# Patient Record
Sex: Female | Born: 1950 | Race: White | Hispanic: No | Marital: Married | Smoking: Never smoker
Health system: Southern US, Community
[De-identification: ages and names within clinical notes are randomized; demographics above are authoritative.]

## PROBLEM LIST (undated history)

## (undated) DIAGNOSIS — K635 Polyp of colon: Secondary | ICD-10-CM

## (undated) DIAGNOSIS — J0100 Acute maxillary sinusitis, unspecified: Secondary | ICD-10-CM

## (undated) DIAGNOSIS — F419 Anxiety disorder, unspecified: Secondary | ICD-10-CM

## (undated) DIAGNOSIS — E785 Hyperlipidemia, unspecified: Secondary | ICD-10-CM

## (undated) DIAGNOSIS — E079 Disorder of thyroid, unspecified: Secondary | ICD-10-CM

## (undated) HISTORY — DX: Disorder of thyroid, unspecified: E07.9

## (undated) HISTORY — DX: Anxiety disorder, unspecified: F41.9

## (undated) HISTORY — DX: Hyperlipidemia, unspecified: E78.5

## (undated) HISTORY — PX: COLONOSCOPY: SHX174

## (undated) HISTORY — DX: Polyp of colon: K63.5

## (undated) HISTORY — DX: Acute maxillary sinusitis, unspecified: J01.00

## (undated) HISTORY — PX: TONSILLECTOMY: SUR1361

---

## 1971-03-14 HISTORY — PX: BREAST LUMPECTOMY: SHX2

## 1997-10-29 ENCOUNTER — Other Ambulatory Visit: Admission: RE | Admit: 1997-10-29 | Discharge: 1997-10-29 | Payer: Self-pay | Admitting: Gynecology

## 1998-04-21 ENCOUNTER — Other Ambulatory Visit: Admission: RE | Admit: 1998-04-21 | Discharge: 1998-04-21 | Payer: Self-pay | Admitting: Gynecology

## 1998-07-03 ENCOUNTER — Other Ambulatory Visit: Admission: RE | Admit: 1998-07-03 | Discharge: 1998-07-03 | Payer: Self-pay | Admitting: Gynecology

## 1998-12-16 ENCOUNTER — Other Ambulatory Visit: Admission: RE | Admit: 1998-12-16 | Discharge: 1998-12-16 | Payer: Self-pay | Admitting: Gynecology

## 1999-06-27 ENCOUNTER — Other Ambulatory Visit: Admission: RE | Admit: 1999-06-27 | Discharge: 1999-06-27 | Payer: Self-pay | Admitting: Gynecology

## 2000-01-17 ENCOUNTER — Other Ambulatory Visit: Admission: RE | Admit: 2000-01-17 | Discharge: 2000-01-17 | Payer: Self-pay | Admitting: Gynecology

## 2001-03-08 ENCOUNTER — Other Ambulatory Visit: Admission: RE | Admit: 2001-03-08 | Discharge: 2001-03-08 | Payer: Self-pay | Admitting: Obstetrics and Gynecology

## 2002-03-10 ENCOUNTER — Other Ambulatory Visit: Admission: RE | Admit: 2002-03-10 | Discharge: 2002-03-10 | Payer: Self-pay | Admitting: Obstetrics and Gynecology

## 2002-10-10 ENCOUNTER — Encounter: Payer: Self-pay | Admitting: Family Medicine

## 2002-10-10 LAB — HM COLONOSCOPY: HM Colonoscopy: ABNORMAL

## 2003-03-30 ENCOUNTER — Other Ambulatory Visit: Admission: RE | Admit: 2003-03-30 | Discharge: 2003-03-30 | Payer: Self-pay | Admitting: Obstetrics and Gynecology

## 2004-03-13 HISTORY — PX: WRIST FRACTURE SURGERY: SHX121

## 2004-04-05 ENCOUNTER — Other Ambulatory Visit: Admission: RE | Admit: 2004-04-05 | Discharge: 2004-04-05 | Payer: Self-pay | Admitting: Obstetrics and Gynecology

## 2005-04-21 ENCOUNTER — Other Ambulatory Visit: Admission: RE | Admit: 2005-04-21 | Discharge: 2005-04-21 | Payer: Self-pay | Admitting: Obstetrics and Gynecology

## 2005-10-13 ENCOUNTER — Ambulatory Visit: Payer: Self-pay | Admitting: Family Medicine

## 2005-10-13 ENCOUNTER — Encounter: Payer: Self-pay | Admitting: Family Medicine

## 2006-04-18 ENCOUNTER — Ambulatory Visit: Payer: Self-pay | Admitting: Family Medicine

## 2007-09-16 ENCOUNTER — Encounter (INDEPENDENT_AMBULATORY_CARE_PROVIDER_SITE_OTHER): Payer: Self-pay | Admitting: *Deleted

## 2007-09-16 ENCOUNTER — Encounter: Payer: Self-pay | Admitting: Family Medicine

## 2007-09-16 LAB — CONVERTED CEMR LAB
ALT: 17 units/L
AST: 16 units/L
CO2, serum: 22 mmol/L
Calcium: 9.6 mg/dL
Chloride, Serum: 106 mmol/L
Cholesterol: 200 mg/dL
Sodium, serum: 140 mmol/L
Total Bilirubin: 0.8 mg/dL

## 2007-10-17 ENCOUNTER — Encounter: Payer: Self-pay | Admitting: Family Medicine

## 2008-09-23 ENCOUNTER — Ambulatory Visit: Payer: Self-pay | Admitting: Family Medicine

## 2008-09-23 DIAGNOSIS — E559 Vitamin D deficiency, unspecified: Secondary | ICD-10-CM

## 2008-09-24 ENCOUNTER — Encounter: Payer: Self-pay | Admitting: Family Medicine

## 2008-09-25 ENCOUNTER — Telehealth (INDEPENDENT_AMBULATORY_CARE_PROVIDER_SITE_OTHER): Payer: Self-pay | Admitting: *Deleted

## 2008-09-25 ENCOUNTER — Encounter (INDEPENDENT_AMBULATORY_CARE_PROVIDER_SITE_OTHER): Payer: Self-pay | Admitting: *Deleted

## 2008-09-25 LAB — CONVERTED CEMR LAB
Alkaline Phosphatase: 75 units/L (ref 39–117)
Basophils Absolute: 0.1 10*3/uL (ref 0.0–0.1)
Bilirubin, Direct: 0.1 mg/dL (ref 0.0–0.3)
CO2: 29 meq/L (ref 19–32)
Calcium: 9.5 mg/dL (ref 8.4–10.5)
Cholesterol: 205 mg/dL — ABNORMAL HIGH (ref 0–200)
Creatinine, Ser: 0.8 mg/dL (ref 0.4–1.2)
Eosinophils Absolute: 0.1 10*3/uL (ref 0.0–0.7)
Glucose, Bld: 104 mg/dL — ABNORMAL HIGH (ref 70–99)
Lymphocytes Relative: 25 % (ref 12.0–46.0)
MCHC: 34.5 g/dL (ref 30.0–36.0)
Neutrophils Relative %: 61 % (ref 43.0–77.0)
RBC: 4.55 M/uL (ref 3.87–5.11)
RDW: 13.8 % (ref 11.5–14.6)
Total CHOL/HDL Ratio: 5
Triglycerides: 95 mg/dL (ref 0.0–149.0)
Vit D, 25-Hydroxy: 48 ng/mL (ref 30–89)

## 2008-11-02 ENCOUNTER — Telehealth (INDEPENDENT_AMBULATORY_CARE_PROVIDER_SITE_OTHER): Payer: Self-pay | Admitting: *Deleted

## 2008-11-23 ENCOUNTER — Ambulatory Visit: Payer: Self-pay | Admitting: Family Medicine

## 2008-11-23 DIAGNOSIS — G4700 Insomnia, unspecified: Secondary | ICD-10-CM

## 2008-11-23 DIAGNOSIS — R030 Elevated blood-pressure reading, without diagnosis of hypertension: Secondary | ICD-10-CM

## 2008-11-23 DIAGNOSIS — E785 Hyperlipidemia, unspecified: Secondary | ICD-10-CM | POA: Insufficient documentation

## 2009-02-03 ENCOUNTER — Ambulatory Visit: Payer: Self-pay | Admitting: Family Medicine

## 2009-02-08 LAB — CONVERTED CEMR LAB
ALT: 25 units/L (ref 0–35)
AST: 27 units/L (ref 0–37)
Alkaline Phosphatase: 79 units/L (ref 39–117)
Total Bilirubin: 1.2 mg/dL (ref 0.3–1.2)

## 2009-05-05 ENCOUNTER — Encounter: Admission: RE | Admit: 2009-05-05 | Discharge: 2009-07-06 | Payer: Self-pay | Admitting: Orthopedic Surgery

## 2009-06-16 ENCOUNTER — Ambulatory Visit: Payer: Self-pay | Admitting: Family Medicine

## 2009-07-15 ENCOUNTER — Telehealth (INDEPENDENT_AMBULATORY_CARE_PROVIDER_SITE_OTHER): Payer: Self-pay | Admitting: *Deleted

## 2009-09-14 ENCOUNTER — Ambulatory Visit: Payer: Self-pay | Admitting: Family Medicine

## 2009-09-14 DIAGNOSIS — H6690 Otitis media, unspecified, unspecified ear: Secondary | ICD-10-CM | POA: Insufficient documentation

## 2009-09-14 LAB — CONVERTED CEMR LAB: Rapid Strep: NEGATIVE

## 2009-09-30 ENCOUNTER — Ambulatory Visit: Payer: Self-pay | Admitting: Family Medicine

## 2009-10-25 ENCOUNTER — Encounter: Payer: Self-pay | Admitting: Family Medicine

## 2009-10-28 ENCOUNTER — Encounter: Payer: Self-pay | Admitting: Family Medicine

## 2009-10-28 LAB — HM MAMMOGRAPHY: HM Mammogram: NORMAL

## 2009-11-02 ENCOUNTER — Telehealth (INDEPENDENT_AMBULATORY_CARE_PROVIDER_SITE_OTHER): Payer: Self-pay | Admitting: *Deleted

## 2009-11-03 ENCOUNTER — Encounter (INDEPENDENT_AMBULATORY_CARE_PROVIDER_SITE_OTHER): Payer: Self-pay | Admitting: *Deleted

## 2010-01-31 ENCOUNTER — Telehealth (INDEPENDENT_AMBULATORY_CARE_PROVIDER_SITE_OTHER): Payer: Self-pay | Admitting: *Deleted

## 2010-04-10 LAB — CONVERTED CEMR LAB
Albumin: 4.4 g/dL (ref 3.5–5.2)
Basophils Relative: 0.5 % (ref 0.0–3.0)
Bilirubin, Direct: 0.2 mg/dL (ref 0.0–0.3)
Calcium: 9.6 mg/dL (ref 8.4–10.5)
Creatinine, Ser: 0.7 mg/dL (ref 0.4–1.2)
Eosinophils Relative: 2.6 % (ref 0.0–5.0)
Lymphocytes Relative: 31.9 % (ref 12.0–46.0)
Monocytes Relative: 9.9 % (ref 3.0–12.0)
Neutrophils Relative %: 55.1 % (ref 43.0–77.0)
RBC: 4.24 M/uL (ref 3.87–5.11)
Sodium: 144 meq/L (ref 135–145)
Total Bilirubin: 1.1 mg/dL (ref 0.3–1.2)
Total CHOL/HDL Ratio: 5
Total Protein: 7.6 g/dL (ref 6.0–8.3)
VLDL: 14 mg/dL (ref 0.0–40.0)
Vit D, 25-Hydroxy: 60 ng/mL (ref 30–89)
WBC: 4.7 10*3/uL (ref 4.5–10.5)

## 2010-04-12 NOTE — Progress Notes (Signed)
Summary: ambien refill   Phone Note Refill Request Call back at [phone] Message from:  Pharmacy on Jul 15, 2009 12:44 PM  Refills Requested: Medication #1:  AMBIEN CR 12.5 MG TBCR 1 by mouth at bedtime as needed   Dosage confirmed as above?Dosage Confirmed   Brand Name Necessary? No   Supply Requested: 1 month   [facility]  Next Appointment Scheduled: none Initial call taken by: Harold Barban,  Jul 15, 2009 12:44 PM    Prescriptions: AMBIEN CR 12.5 MG TBCR (ZOLPIDEM TARTRATE) 1 by mouth at bedtime as needed  #30 x 0   Entered by:   Doristine Devoid   Authorized by:   Neena Rhymes MD   Signed by:   Doristine Devoid on 07/15/2009   Method used:   Printed then faxed to ...       [facility] [phone]* (retail)       [address]       [place], [place]  [address]       Ph: [phone]       Fax: [phone]   RxID:   [id number]

## 2010-04-12 NOTE — Assessment & Plan Note (Signed)
Summary: loosing voice//lch   Vital Signs:  Patient profile:   60 year old female Weight:      140 pounds Temp:     99.0 degrees F oral BP sitting:   114 / 78  (left arm)  Vitals Entered By: Gabrielle Snow (June 16, 2009 2:18 PM) CC: loosing voice x1 week some dry cough   History of Present Illness: 60 yo woman here today for hoarseness.  started with nasal congestion 1 week ago and lost voice 3 days ago.  mild dry cough when pt strains to talk.  no fevers, ear pain.  nasal congestion has improved.  mild HA.  pt reports she overall feels 'well' but wants her voice back.  Allergies (verified): No Known Drug Allergies  Review of Systems      See HPI  Physical Exam  General:  Well-developed,well-nourished,in no acute distress; alert,appropriate and cooperative throughout examination Head:  no TTP over sinuses Eyes:  no injxn or inflammation Ears:  WNL Nose:  mild congestion Mouth:  + PND Neck:  No deformities, masses, or tenderness noted. Lungs:  Normal respiratory effort, chest expands symmetrically. Lungs are clear to auscultation, no crackles or wheezes. Heart:  Normal rate and regular rhythm. S1 and S2 normal without gallop, murmur, click, rub or other extra sounds.   Impression & Recommendations:  Problem # 1:  LARYNGITIS-ACUTE (ICD-464.00) Assessment New likely due to virus.  reviewed that there is no cure except time and vocal rest.  sample of Nasonex given for nasal congestion and PND.  reviewed supportive care and red flags that should prompt return.  Pt expresses understanding and is in agreement w/ this plan.  Complete Medication List: 1)  Ambien Cr 12.5 Mg Tbcr (Zolpidem tartrate) .Marland Kitchen.. 1 by mouth at bedtime as needed 2)  Zetia 10 Mg Tabs (Ezetimibe) .Marland Kitchen.. 1 by mouth daily  Patient Instructions: 1)  Please schedule a follow-up appointment as needed.  2)  Take Ibuprofen to decrease the inflammation 3)  Hot liquids will provide some relief 4)  REST YOUR  VOICE!!! 5)  Use the nasal steroid spray- 2 sprays each nostril once daily- to decrease post nasal drip 6)  Hang in there!!

## 2010-04-12 NOTE — Assessment & Plan Note (Signed)
Summary: SORE THROAT/FEVER//KN   Vital Signs:  Patient profile:   60 year old female Weight:      136 pounds Temp:     98.7 degrees F oral BP sitting:   112 / 70  (left arm)  Vitals Entered By: Doristine Devoid (September 14, 2009 11:14 AM) CC: sore throat xsat. fever up to 100.8   History of Present Illness: 60 yo woman here today for sore throat and fever.  sxs started on Sat.  + body aches, fatigue.  L ear pain.  mild nasal congestion but increased PND.  + HA- occipital.  no cough.  no known sick contacts.  Current Medications (verified): 1)  Ambien Cr 12.5 Mg Tbcr (Zolpidem Tartrate) .Marland Kitchen.. 1 By Mouth At Bedtime As Needed 2)  Zetia 10 Mg Tabs (Ezetimibe) .Marland Kitchen.. 1 By Mouth Daily  Allergies (verified): No Known Drug Allergies  Review of Systems      See HPI  Physical Exam  General:  Well-developed,well-nourished,in no acute distress; alert,appropriate and cooperative throughout examination Head:  no TTP over sinuses Eyes:  no injxn or inflammation Ears:  L TM dull, erythematous w/ visible fluid Nose:  mild congestion Mouth:  + PND Lungs:  Normal respiratory effort, chest expands symmetrically. Lungs are clear to auscultation, no crackles or wheezes. Heart:  Normal rate and regular rhythm. S1 and S2 normal without gallop, murmur, click, rub or other extra sounds. Cervical Nodes:  No lymphadenopathy noted   Impression & Recommendations:  Problem # 1:  LOM (ICD-382.9) Assessment New  pt w/ L ear infxn.  likely cause of body aches and fevers.  start amox.  reviewed supportive care and red flags that should prompt return.  Pt expresses understanding and is in agreement w/ this plan. Her updated medication list for this problem includes:    Amoxicillin 500 Mg Tabs (Amoxicillin) .Marland Kitchen... 1 tab by mouth two times a day x10 days  Orders: Rapid Strep (04540)  Complete Medication List: 1)  Ambien Cr 12.5 Mg Tbcr (Zolpidem tartrate) .Marland Kitchen.. 1 by mouth at bedtime as needed 2)  Zetia 10 Mg  Tabs (Ezetimibe) .Marland Kitchen.. 1 by mouth daily 3)  Amoxicillin 500 Mg Tabs (Amoxicillin) .Marland Kitchen.. 1 tab by mouth two times a day x10 days  Patient Instructions: 1)  Take the Amoxicillin as directed for your ear infection 2)  Start Mucinex to thin your congestion and facilitate drainage 3)  Drink plenty of fluids 4)  Tylenol/Ibuprofen for pain and fever 5)  Call with any questions or concerns 6)  Hang in there! Prescriptions: AMOXICILLIN 500 MG TABS (AMOXICILLIN) 1 tab by mouth two times a day x10 days  #20 x 0   Entered and Authorized by:   Neena Rhymes MD   Signed by:   Neena Rhymes MD on 09/14/2009   Method used:   [facility]ronically to        [facility] [phone][facility] (retail)       [address]       [place], [place]  [address]       Ph: [phone]       Fax: [phone]   RxID:   [id number]     Laboratory Results    Wet [place]/KOH  Other Tests  Rapid Strep: negative

## 2010-04-12 NOTE — Progress Notes (Signed)
Summary: Gabrielle Snow refill   Phone Note Refill Request Message from:  Fax from Pharmacy on January 31, 2010 8:31 AM  Refills Requested: Medication #1:  AMBIEN CR 12.5 MG TBCR 1 by mouth at bedtime as needed [facility] [phone] - tel [phone]  Next Appointment Scheduled: none Initial call taken by: Okey Regal Spring,  January 31, 2010 8:34 AM    Prescriptions: AMBIEN CR 12.5 MG TBCR (ZOLPIDEM TARTRATE) 1 by mouth at bedtime as needed  #30 x 1   Entered by:   Doristine Devoid CMA   Authorized by:   Neena Rhymes MD   Signed by:   Doristine Devoid CMA on 01/31/2010   Method used:   Printed then faxed to ...       [facility] [phone]* (retail)       [address]       [place], [place]  [address]       Ph: [phone]       Fax: [phone]   RxID:   [id number]

## 2010-04-12 NOTE — Miscellaneous (Signed)
  Clinical Lists Changes  Observations: Added new observation of MAMMOGRAM: normal (10/28/2009 14:34)      Preventive Care Screening  Mammogram:    Date:  10/28/2009    Results:  normal

## 2010-04-12 NOTE — Progress Notes (Signed)
Summary: bone density test  Phone Note Outgoing Call   Call placed by: Doristine Devoid CMA,  November 02, 2009 11:59 AM Call placed to: Patient Summary of Call: received bone density results- per Dr. Beverely Low osteopenic and 2 choices 1) caltrate otc two times a day or 2) fosamax 35mg  weekly should also increase weight bearing exercises (walking, light weights)  Follow-up for Phone Call        left message on machine .......Marland KitchenDoristine Devoid CMA  November 02, 2009 12:13 PM   Patient called and and LM on triage VM for a call back, [email or web address].Harold Barban  November 02, 2009 3:22 PM  Additional Follow-up for Phone Call Additional follow up Details #1::        Patient is aware and is going to try the caltrate. Additional Follow-up by: Harold Barban,  November 03, 2009 9:08 AM

## 2010-04-12 NOTE — Assessment & Plan Note (Signed)
Summary: cpx/pt will be fasting//kn   Vital Signs:  Patient profile:   60 year old female Height:      63.25 inches (160.66 cm) Weight:      133.13 pounds (60.51 kg) BMI:     23.48 Temp:     98.2 degrees F (36.78 degrees C) BP sitting:   126 / 84  (left arm) Cuff size:   regular  Vitals Entered By: Lucious Groves CMA (September 30, 2009 8:34 AM) CC: CPX./kb Is Patient Diabetic? No Pain Assessment Patient in pain? no      Comments Patient notes that she needs paper to take with her for Bone Density Scan tomorrow./kb   History of Present Illness: 60 yo woman here today for CPE.  GYN- Nestor Ramp, due in August.  Bone Density tomorrow at [facility].  insomnia- pt reports difficulty falling asleep b/c 'i can't turn my brain off'.  'my thoughts keep racing'.  pt admits she is 'high strung' and 'a worrier'.  has daughter's wedding coming up in 1 week and is stressed about this.  rarely takes her [place] script- 'only if i haven't slept in 5 days'.  Preventive Screening-Counseling & Management  Alcohol-Tobacco     Alcohol drinks/day: 0     Smoking Status: never  Caffeine-Diet-Exercise     Does Patient Exercise: yes     Type of exercise: walk on treadmill      Sexual History:  currently monogamous.        Drug Use:  never.    Current Medications (verified): 1)  Ambien Cr 12.5 Mg Tbcr (Zolpidem Tartrate) .Marland Kitchen.. 1 By Mouth At Bedtime As Needed 2)  Zetia 10 Mg Tabs (Ezetimibe) .Marland Kitchen.. 1 By Mouth Daily 3)  Vitamin D .... 1 By Mouth Qd 4)  Calcium .Marland Kitchen.. 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    Physical Exam  General:  Well-developed,well-nourished,in no acute distress; alert,appropriate and cooperative throughout examination Head:  Normocephalic and atraumatic without obvious abnormalities. No apparent alopecia or balding. Eyes:  PERRL, EOMI, fundi WNL, R eye w/ corneal abnormality (followed by ophtho) Ears:  External ear exam shows no significant lesions or deformities.  Otoscopic examination reveals clear canals, tympanic membranes are intact bilaterally without bulging, retraction, inflammation or discharge. Hearing is grossly normal bilaterally. Nose:  External nasal examination shows no deformity or inflammation. Nasal mucosa are pink and moist without lesions or exudates. Mouth:  Oral mucosa and oropharynx without lesions or exudates.  Teeth in good repair. Neck:  No deformities, masses, or tenderness noted. Breasts:  deferred to GYN Lungs:  Normal respiratory effort, chest expands symmetrically. Lungs are clear to auscultation, no crackles or wheezes. Heart:  Normal rate and regular rhythm. S1 and S2 normal without gallop, murmur, click, rub or other extra  sounds. Abdomen:  Bowel sounds positive,abdomen soft and non-tender without masses, organomegaly or hernias noted. Genitalia:  deferred to GYN Msk:  No deformity or scoliosis noted of thoracic or lumbar spine.   Pulses:  +2 carotid, radial, DP Extremities:  No clubbing, cyanosis, edema, or deformity noted with  normal full range of motion of all joints.   Neurologic:  No cranial nerve deficits noted. Station and gait are normal. Plantar reflexes are down-going bilaterally. DTRs are symmetrical throughout. Sensory, motor and coordinative functions appear intact. Skin:  Intact without suspicious lesions or rashes Cervical Nodes:  No lymphadenopathy noted Axillary Nodes:  No palpable lymphadenopathy Inguinal Nodes:  No significant adenopathy Psych:  Cognition and judgment appear intact. Alert and cooperative with normal attention span and concentration. No apparent delusions, illusions, hallucinations.  anxious.   Impression & Recommendations:  Problem # 1:  PHYSICAL EXAMINATION (ICD-V70.0) Assessment Unchanged Pt's PE WNL.  check labs.  anticipatory guidance provided.  UTD on health maintainence. Orders: TLB-BMP (Basic Metabolic Panel-BMET) (80048-METABOL) TLB-CBC Platelet - w/Differential (85025-CBCD) TLB-TSH (Thyroid Stimulating Hormone) (56213-YQM) Radiology Referral (Radiology) Specimen Handling (57846) EKG w/ Interpretation (93000)  Problem # 2:  HYPERLIPIDEMIA (ICD-272.4) Assessment: Unchanged due for labs Her updated medication list for this problem includes:    Zetia 10 Mg Tabs (Ezetimibe) .Marland Kitchen... 1 by mouth daily  Orders: Venipuncture (96295) TLB-Lipid Panel (80061-LIPID) TLB-Hepatic/Liver Function Pnl (80076-HEPATIC)  Problem # 3:  VITAMIN D DEFICIENCY (ICD-268.9) Assessment: Unchanged due for lab recheck.  has bone density scheduled for tomorrow Orders: T-Vitamin D (25-Hydroxy) [id number]) Radiology Referral (Radiology)  Problem # 4:  INSOMNIA-SLEEP DISORDER-UNSPEC (ICD-780.52) Assessment: Unchanged discussed possibility of starting SSRI or Buspar for anxiety.  pt wants to 'get through' the wedding and will see how her sleep changes once she is not worried about this.  will follow. Her updated medication list for this problem includes:    Ambien Cr 12.5 Mg Tbcr (Zolpidem  tartrate) .Marland Kitchen... 1 by mouth at bedtime as needed  Complete Medication List: 1)  Ambien Cr 12.5 Mg Tbcr (Zolpidem tartrate) .Marland Kitchen.. 1 by mouth at bedtime as needed 2)  Zetia 10 Mg Tabs (Ezetimibe) .Marland Kitchen.. 1 by mouth daily 3)  Vitamin D  .... 1 by mouth qd 4)  Calcium  .Marland KitchenMarland Kitchen. 1 by mouth bid  Patient Instructions: 1)  Follow up in 6 months or as needed 2)  If you decide you want to start a medicine for your anxiety/sleep issues- let me know 3)  Call with any questions or concerns 4)  We'll notify you of your lab results 5)  Enjoy the wedding!!

## 2010-06-19 ENCOUNTER — Other Ambulatory Visit: Payer: Self-pay | Admitting: Family Medicine

## 2010-09-07 ENCOUNTER — Other Ambulatory Visit: Payer: Self-pay | Admitting: Family Medicine

## 2010-09-07 MED ORDER — ZOLPIDEM TARTRATE ER 6.25 MG PO TBCR: 6.2500 mg | EXTENDED_RELEASE_TABLET | Freq: Every evening | ORAL | Status: DC | PRN

## 2010-09-07 NOTE — Telephone Encounter (Signed)
Refill sent. Mailed letter to schedule CPX.

## 2010-09-07 NOTE — Telephone Encounter (Signed)
Last refilled 01/2010. Pt CPX is coming due 09/2010.

## 2010-09-07 NOTE — Telephone Encounter (Signed)
Ok for #30, please remind her of CPE

## 2010-09-10 ENCOUNTER — Other Ambulatory Visit: Payer: Self-pay | Admitting: Family Medicine

## 2010-09-10 NOTE — Telephone Encounter (Signed)
Refill sent with notation

## 2010-09-10 NOTE — Telephone Encounter (Signed)
Notation was made on last refill (06/19/10) that pt needs appt before further refills. Please advise.

## 2010-09-10 NOTE — Telephone Encounter (Signed)
Ok to refill until August but then she is due for CPE

## 2010-09-12 ENCOUNTER — Telehealth: Payer: Self-pay | Admitting: *Deleted

## 2010-09-12 NOTE — Telephone Encounter (Signed)
Prior auth approved from 08-19-10 until 09-09-2011, pharmacy notified, approval letter scan to chart.

## 2010-09-28 ENCOUNTER — Other Ambulatory Visit (HOSPITAL_COMMUNITY): Payer: BC Managed Care – PPO

## 2010-09-28 ENCOUNTER — Encounter (HOSPITAL_COMMUNITY)
Admission: RE | Admit: 2010-09-28 | Discharge: 2010-09-28 | Disposition: A | Discharge status: Home / Self Care | Payer: BC Managed Care – PPO | Source: Ambulatory Visit | Attending: Ophthalmology | Admitting: Ophthalmology

## 2010-09-28 LAB — CBC
HCT: 37.6 % (ref 36.0–46.0)
MCV: 85.8 fL (ref 78.0–100.0)
Platelets: 192 10*3/uL (ref 150–400)
RBC: 4.38 MIL/uL (ref 3.87–5.11)
RDW: 13 % (ref 11.5–15.5)
WBC: 5 10*3/uL (ref 4.0–10.5)

## 2010-09-28 LAB — BASIC METABOLIC PANEL
Calcium: 9.8 mg/dL (ref 8.4–10.5)
GFR calc Af Amer: >60 mL/min (ref >60)
GFR calc non Af Amer: >60 mL/min (ref >60)
Glucose, Bld: 93 mg/dL (ref 70–99)
Potassium: 4.5 mEq/L (ref 3.5–5.1)
Sodium: 138 mEq/L (ref 135–145)

## 2010-09-29 ENCOUNTER — Encounter: Payer: Self-pay | Admitting: Family Medicine

## 2010-09-29 ENCOUNTER — Encounter: Payer: Self-pay | Admitting: *Deleted

## 2010-09-30 ENCOUNTER — Ambulatory Visit (HOSPITAL_COMMUNITY)
Admission: RE | Admit: 2010-09-30 | Discharge: 2010-09-30 | Disposition: A | Discharge status: Home / Self Care | Payer: BC Managed Care – PPO | Source: Ambulatory Visit | Attending: Ophthalmology | Admitting: Ophthalmology

## 2010-09-30 ENCOUNTER — Other Ambulatory Visit: Payer: Self-pay | Admitting: Ophthalmology

## 2010-09-30 DIAGNOSIS — H179 Unspecified corneal scar and opacity: Secondary | ICD-10-CM | POA: Insufficient documentation

## 2010-09-30 DIAGNOSIS — Z01812 Encounter for preprocedural laboratory examination: Secondary | ICD-10-CM | POA: Insufficient documentation

## 2010-09-30 DIAGNOSIS — H11009 Unspecified pterygium of unspecified eye: Secondary | ICD-10-CM | POA: Insufficient documentation

## 2010-10-11 ENCOUNTER — Encounter: Payer: Self-pay | Admitting: Family Medicine

## 2010-10-11 ENCOUNTER — Ambulatory Visit (INDEPENDENT_AMBULATORY_CARE_PROVIDER_SITE_OTHER): Payer: BC Managed Care – PPO | Admitting: Family Medicine

## 2010-10-11 DIAGNOSIS — Z0000 Encounter for general adult medical examination without abnormal findings: Secondary | ICD-10-CM | POA: Insufficient documentation

## 2010-10-11 DIAGNOSIS — E559 Vitamin D deficiency, unspecified: Secondary | ICD-10-CM

## 2010-10-11 DIAGNOSIS — E785 Hyperlipidemia, unspecified: Secondary | ICD-10-CM

## 2010-10-11 LAB — BASIC METABOLIC PANEL WITH GFR
BUN: 17 mg/dL (ref 6–23)
CO2: 31 meq/L (ref 19–32)
Calcium: 9.2 mg/dL (ref 8.4–10.5)
Chloride: 104 meq/L (ref 96–112)
Creatinine, Ser: 0.7 mg/dL (ref 0.4–1.2)
GFR: 97.31 mL/min
Glucose, Bld: 81 mg/dL (ref 70–99)
Potassium: 4 meq/L (ref 3.5–5.1)
Sodium: 143 meq/L (ref 135–145)

## 2010-10-11 LAB — HEPATIC FUNCTION PANEL
ALT: 21 U/L (ref 0–35)
AST: 21 U/L (ref 0–37)
Albumin: 4.8 g/dL (ref 3.5–5.2)
Alkaline Phosphatase: 74 U/L (ref 39–117)
Bilirubin, Direct: 0.1 mg/dL (ref 0.0–0.3)
Total Bilirubin: 1.3 mg/dL — ABNORMAL HIGH (ref 0.3–1.2)
Total Protein: 7.8 g/dL (ref 6.0–8.3)

## 2010-10-11 LAB — CBC WITH DIFFERENTIAL/PLATELET
Basophils Absolute: 0 10*3/uL (ref 0.0–0.1)
Eosinophils Relative: 3.5 % (ref 0.0–5.0)
HCT: 36.6 % (ref 36.0–46.0)
Lymphocytes Relative: 33.1 % (ref 12.0–46.0)
Monocytes Relative: 10.6 % (ref 3.0–12.0)
Neutrophils Relative %: 52.3 % (ref 43.0–77.0)
Platelets: 205 10*3/uL (ref 150.0–400.0)
RDW: 13.6 % (ref 11.5–14.6)
WBC: 4.9 10*3/uL (ref 4.5–10.5)

## 2010-10-11 LAB — LIPID PANEL
LDL Cholesterol: 123 mg/dL — ABNORMAL HIGH (ref 0–99)
Total CHOL/HDL Ratio: 5
Triglycerides: 75 mg/dL (ref 0.0–149.0)

## 2010-10-11 LAB — TSH: TSH: 3.49 u[IU]/mL (ref 0.35–5.50)

## 2010-10-11 NOTE — Assessment & Plan Note (Signed)
Pt's PE WNL.  UTD on health maintenance.  Check labs.  Anticipatory guidance provided.  

## 2010-10-11 NOTE — Assessment & Plan Note (Signed)
Check labs, restart meds prn.

## 2010-10-11 NOTE — Patient Instructions (Signed)
Your exam looks great!  Keep up the good work! We'll notify you of your lab results Call with any questions or concerns Congrats on the grandbabies!!!

## 2010-10-11 NOTE — Progress Notes (Signed)
  Subjective:    Patient ID: Gabrielle Snow, female    DOB: Mar 07, 1951, 60 y.o.   MRN: [id number]  HPI CPE- UTD on colonoscopy, mammo, pap.  GYN- Nestor Ramp.  No concerns today.  Review of Systems ROS Patient reports no vision/ hearing changes, adenopathy,fever, weight change,  persistant/recurrent hoarseness , swallowing issues, chest pain, palpitations, edema, persistant/recurrent cough, hemoptysis, dyspnea (rest/exertional/paroxysmal nocturnal), gastrointestinal bleeding (melena, rectal bleeding), abdominal pain, significant heartburn, bowel changes, GU symptoms (dysuria, hematuria, incontinence), Gyn symptoms (abnormal  bleeding, pain),  syncope, focal weakness, memory loss, numbness & tingling, skin/hair/nail changes, abnormal bruising or bleeding, anxiety, or depression.     Objective:   Physical Exam  General Appearance:    Alert, cooperative, no distress, appears stated age  Head:    Normocephalic, without obvious abnormality, atraumatic  Eyes:    PERRL, conjunctiva/corneas clear, EOM's intact, fundoscopic exam deferred to ophtho  Ears:    Normal TM's and external ear canals, both ears  Nose:   Nares normal, septum midline, mucosa normal, no drainage    or sinus tenderness  Throat:   Lips, mucosa, and tongue normal; teeth and gums normal  Neck:   Supple, symmetrical, trachea midline, no adenopathy;    Thyroid: no enlargement/tenderness/nodules  Back:     Symmetric, no curvature, ROM normal, no CVA tenderness  Lungs:     Clear to auscultation bilaterally, respirations unlabored  Chest Wall:    No tenderness or deformity   Heart:    Regular rate and rhythm, S1 and S2 normal, no murmur, rub   or gallop  Breast Exam:    Deferred to GYN  Abdomen:     Soft, non-tender, bowel sounds active all four quadrants,    no masses, no organomegaly  Genitalia:    Deferred to GYN  Rectal:    Deferred to GYN  Extremities:   Extremities normal, atraumatic, no cyanosis or edema  Pulses:   2+  and symmetric all extremities  Skin:   Skin color, texture, turgor normal, no rashes or lesions  Lymph nodes:   Cervical, supraclavicular, and axillary nodes normal  Neurologic:   CNII-XII intact, normal strength, sensation and reflexes    throughout          Assessment & Plan:   No problem-specific assessment & plan notes found for this encounter.

## 2010-10-11 NOTE — Assessment & Plan Note (Signed)
Check labs.  Adjust meds prn  

## 2010-10-12 ENCOUNTER — Encounter: Payer: Self-pay | Admitting: *Deleted

## 2010-10-14 LAB — VITAMIN D 1,25 DIHYDROXY
Vitamin D 1, 25 (OH)2 Total: 49 pg/mL (ref 18–72)
Vitamin D2 1, 25 (OH)2: <8 pg/mL

## 2010-10-20 ENCOUNTER — Other Ambulatory Visit: Payer: Self-pay | Admitting: Obstetrics and Gynecology

## 2010-11-02 NOTE — Op Note (Signed)
NAME:  Gabrielle Snow, Gabrielle Snow NO.:  [id number]  MEDICAL RECORD NO.:  LOCATION:                                 FACILITY:  PHYSICIAN:  Chapman Fitch, MD          DATE OF BIRTH:  DATE OF PROCEDURE:  09/30/2010 DATE OF DISCHARGE:                              OPERATIVE REPORT   PREOPERATIVE DIAGNOSES: 1. Corneal scar, right eye. 2. Corneal pterygium, right eye.  POSTOPERATIVE DIAGNOSES: 1. Corneal scar, right eye. 2. Corneal pterygium, right eye.  OPERATIVE PROCEDURE:  Ocular surface reconstruction with amniotic membrane grafting, right eye, [id number] - RT.  SURGEON:  Chapman Fitch, MD  ASSISTANT:  Dolly Rias, RN  ANESTHESIA:  Peribulbar block with monitored anesthesia care.  MATERIAL REPORTED TO LAB:  Pterygium tissue, right eye.  ESTIMATED BLOOD LOSS:  5 mL.  COMPLICATIONS:  None.  INDICATIONS FOR OPERATION:  The patient had chronic irritation, redness, and recurrent inflammation involving a Pterygium of the right eye.  The Pterygium was showing progressive growth, and developing corneal scarring along the anterior leading edge of the pterygium.  This was creating irregular corneal astigmatism and decreased visual acuity.  The corneal scarring was advancing through the visual axis.  The risks, benefits and alternatives to ocular surface reconstruction with removal of corneal scarring and pterygium were discussed with the patient in detail, including the risk of pterygium recurrence, pain, infection, chronic redness, persistent corneal astigmatism requiring refractive correction, and the risk of antimetabolite use including scleral and/or corneal melting postoperatively.  The patient understood and chose to proceed with the planned surgery.  Informed operative consent was obtained.  DESCRIPTION OF OPERATION:  The correct surgical site was identified and marked by the operating surgeon in the preoperative area.  The patient was then taken into the  operating room and placed supine on the operating table.  A time-out confirmation was performed by the entire surgical team including the Anesthesia provider.  Intravenous sedation was then administered by the anesthetist.  A peribulbar block was administered for the right eye using a retrobulbar needle and approximately 5 mL of an equal mixture of 1% lidocaine with epinephrine and 0.25% Marcaine mixed with Wydase.  The ocular surface and the periocular area were then prepped with ophthalmic Betadine and draped in usual sterile fashion for surgery.  Adequate ocular anesthesia was confirmed.  The operating microscope was brought into position over the operative eye.  The pterygium was identified and marked at its edges using a sterile skin marking pen.  The pterygium was then undermined and excised using sharp-tipped Westcott scissors with careful attention to avoid any iatrogenic injury to the medial rectus muscle.  The excision was carried out to remove the fibrous membrane and expose the various sclera.  Next, a #64 Beaver blade was used to excise the corneal scarring from the nasal aspect of the anterior cornea.  This was performed by carefully identifying a surgical plane just beneath the corneal scar, with the excision performed carefully and judiciously to avoid any excessive removal of corneal stromal tissue.  Next, the corneal excision site and limbal transition area were smoothed using an ophthalmic burr.  Excellent smoothing  of the corneal and limbal surfaces were obtained at the excision site.  Limited use of bipolar cautery was applied over the exposed sclera for hemostasis.  A surgical sponge was fashioned from an instrument wipe and trimmed to the side of the exposed sclera.  The surgical sponge was then soaked in mitomycin-C at concentration of 0.04%.  The sponge was then placed over the exposed sclera and to the undersurface of the conjunctiva for the exactly 20 seconds  and then removed.  Degrees irrigation of the ocular surface was then performed using balanced saline solution.  Next, an amniotic membrane graft was trimmed using Westcott scissors to completely cover the area of the exposed sclera and excision site with overlap of at least 1 mm of the graft beyond the conjunctival excision edges.  The graft was secured in place using a running 8-0 Vicryl suture, with the graft tucked under the conjunctival edges.  Next, the corneal light shield, which had been placed throughout the procedure, was removed from the corneal surface.  A bandage soft contact lens was applied over the cornea.  The eyelid speculum and drapes were removed.  Several drops of Polytrim were applied to the ocular surface.  The eye was then patched closed using sterile eye pads secured in place with tap.  The surgical specimen was sent for pathology, which is currently pending.  The patient was then transferred back to the recovery area in stable condition, having tolerated the procedure well, and having suffered no surgical or anesthetic complications.  The patient was discharged in satisfactory condition.  ______________________________ Chapman Fitch, MD     GH/MEDQ  D:  09/30/2010  T:  10/01/2010  Job:  [id number]  Electronically Signed by Chapman Fitch MD on 11/02/2010 12:57:03 PM

## 2010-12-05 ENCOUNTER — Other Ambulatory Visit: Payer: Self-pay | Admitting: Family Medicine

## 2011-02-01 ENCOUNTER — Other Ambulatory Visit: Payer: Self-pay | Admitting: Obstetrics and Gynecology

## 2011-02-10 ENCOUNTER — Other Ambulatory Visit: Payer: Self-pay | Admitting: Family Medicine

## 2011-02-10 MED ORDER — ZOLPIDEM TARTRATE ER 6.25 MG PO TBCR: 6.2500 mg | EXTENDED_RELEASE_TABLET | Freq: Every evening | ORAL | Status: DC | PRN

## 2011-02-10 NOTE — Telephone Encounter (Signed)
Ok for #30 

## 2011-02-10 NOTE — Telephone Encounter (Signed)
Manually faxed to [facility]

## 2011-02-10 NOTE — Telephone Encounter (Signed)
Last OV 10-11-10 last refill 09-09-10

## 2011-04-18 ENCOUNTER — Other Ambulatory Visit: Payer: Self-pay | Admitting: Family Medicine

## 2011-04-20 MED ORDER — EZETIMIBE 10 MG PO TABS: 10.0000 mg | ORAL_TABLET | Freq: Every day | ORAL | Status: DC

## 2011-04-20 NOTE — Telephone Encounter (Signed)
rx sent to pharmacy by e-script Letter has been mailed to pt address noted in the chart to advise they are overdue for cpe/ov/labs and the pt needs to contact office to set up appt   

## 2011-04-30 ENCOUNTER — Ambulatory Visit (INDEPENDENT_AMBULATORY_CARE_PROVIDER_SITE_OTHER): Payer: BC Managed Care – PPO | Admitting: Family Medicine

## 2011-04-30 VITALS — BP 125/82 | HR 76 | Temp 97.9°F | Resp 16 | Ht 62.5 in | Wt 127.6 lb

## 2011-04-30 DIAGNOSIS — J019 Acute sinusitis, unspecified: Secondary | ICD-10-CM

## 2011-04-30 MED ORDER — PREDNISONE 20 MG PO TABS: ORAL_TABLET | ORAL | Status: AC

## 2011-04-30 MED ORDER — AZITHROMYCIN 250 MG PO TABS: ORAL_TABLET | ORAL | Status: AC

## 2011-04-30 NOTE — Patient Instructions (Signed)
Thank you for coming in today.  I appreciate your patience as we become more comfortable with our computer system.  Today you saw Catlyn Shipton, MD. I hope you feel better quickly. If you were not given printed prescriptions today, your medications have been sent to your specified pharmacy and can be picked up there.  Please review the information below regarding your diagnosis(es) at your leisure.     Sinusitis Sinuses are air pockets within the bones of your face. The growth of bacteria within a sinus leads to infection. The infection prevents the sinuses from draining. This infection is called sinusitis. SYMPTOMS  There will be different areas of pain depending on which sinuses have become infected.  The maxillary sinuses often produce pain beneath the eyes.   Frontal sinusitis may cause pain in the middle of the forehead and above the eyes.  Other problems (symptoms) include:  Toothaches.   Colored, pus-like (purulent) drainage from the nose.   Swelling, warmth, and tenderness over the sinus areas may be signs of infection.  TREATMENT  Sinusitis is most often determined by an exam.X-rays may be taken. If x-rays have been taken, make sure you obtain your results or find out how you are to obtain them. Your caregiver may give you medications (antibiotics). These are medications that will help kill the bacteria causing the infection. You may also be given a medication (decongestant) that helps to reduce sinus swelling.  HOME CARE INSTRUCTIONS   Only take over-the-counter or prescription medicines for pain, discomfort, or fever as directed by your caregiver.   Drink extra fluids. Fluids help thin the mucus so your sinuses can drain more easily.   Applying either moist heat or ice packs to the sinus areas may help relieve discomfort.   Use saline nasal sprays to help moisten your sinuses. The sprays can be found at your local drugstore.  SEEK IMMEDIATE MEDICAL CARE IF:  You have a  fever.   You have increasing pain, severe headaches, or toothache.   You have nausea, vomiting, or drowsiness.   You develop unusual swelling around the face or trouble seeing.  MAKE SURE YOU:   Understand these instructions.   Will watch your condition.   Will get help right away if you are not doing well or get worse.  Document Released: 02/27/2005 Document Revised: 11/09/2010 Document Reviewed: 09/26/2006 Exit[employer] Patient Information 2012 [employer]. 

## 2011-04-30 NOTE — Progress Notes (Signed)
  Subjective:    Patient ID: Gabrielle Snow, female    DOB: 06/08/1950, 61 y.o.   MRN: [id number]  HPI 61 yo female with concern for sinusitis. Symptoms for 6 days.  Sinus headache.  Nasal congestion, colored.  Fatigue.  Malaise.  No cough.  No ST or ear.  PND.  No fever.  Tried sudafed, not helpful. 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  Grandsons also sick too.    Review of Systems Negative except as per HPI     Objective:   Physical Exam  Constitutional: She appears well-developed. No distress.  HENT:  Right Ear: Tympanic membrane, external ear and ear canal normal. Tympanic membrane is not injected, not scarred, not perforated, not erythematous, not retracted and not bulging.  Left Ear: Tympanic membrane, external ear and ear canal normal. Tympanic membrane is not injected, not scarred, not perforated, not erythematous, not retracted and not bulging.  Nose: Mucosal edema present. No rhinorrhea. Right sinus exhibits no maxillary sinus tenderness and no frontal sinus tenderness. Left sinus exhibits no maxillary sinus tenderness and no frontal sinus tenderness.  Mouth/Throat: Uvula is midline, oropharynx is clear and moist and mucous membranes are normal. No oropharyngeal exudate or tonsillar abscesses.  Cardiovascular: Normal rate, regular rhythm, normal heart sounds and intact distal pulses.   No murmur heard. Pulmonary/Chest: Effort normal and breath sounds normal. No respiratory distress. She has no wheezes. She has no rales.  Lymphadenopathy:       Head (right side): No submandibular and no preauricular adenopathy present.       Head (left side): No submandibular and no preauricular adenopathy present.       Right cervical: No superficial cervical and no posterior cervical adenopathy present.      Left cervical: No superficial cervical and no posterior cervical adenopathy present.       Right: No supraclavicular adenopathy present.       Left: No supraclavicular adenopathy  present.  Skin: Skin is warm and dry.          Assessment & Plan:  Sinusitis Zpak, prednisone

## 2011-06-20 ENCOUNTER — Other Ambulatory Visit: Payer: Self-pay | Admitting: *Deleted

## 2011-06-20 MED ORDER — EZETIMIBE 10 MG PO TABS: 10.0000 mg | ORAL_TABLET | Freq: Every day | ORAL | Status: DC

## 2011-06-20 NOTE — Telephone Encounter (Signed)
Refill request zetia 10mg  #30, rx sent to pharmacy by e-script Letter has been mailed to pt address noted in the chart to advise they are overdue for cpe/ov/labs and the pt needs to contact office to set up appt

## 2011-08-16 ENCOUNTER — Telehealth: Payer: Self-pay | Admitting: Family Medicine

## 2011-08-16 MED ORDER — ZOLPIDEM TARTRATE ER 6.25 MG PO TBCR: 6.2500 mg | EXTENDED_RELEASE_TABLET | Freq: Every evening | ORAL | Status: DC | PRN

## 2011-08-16 NOTE — Telephone Encounter (Signed)
Refill: Zolpidem tart er 6.25mg  tab. Take 1 tablet by mouth at bedtime as needed for sleep.

## 2011-08-16 NOTE — Telephone Encounter (Signed)
Ok for #30, 1 refill 

## 2011-08-16 NOTE — Telephone Encounter (Signed)
Last OV 10-11-10 last refill 02-10-11 #30 with no refills

## 2011-08-16 NOTE — Telephone Encounter (Signed)
.  rx faxed to pharmacy, manually.  

## 2011-08-18 ENCOUNTER — Telehealth: Payer: Self-pay | Admitting: Family Medicine

## 2011-08-18 MED ORDER — EZETIMIBE 10 MG PO TABS: 10.0000 mg | ORAL_TABLET | Freq: Every day | ORAL | Status: DC

## 2011-08-18 NOTE — Telephone Encounter (Signed)
rx sent to pharmacy by e-script Letter has been mailed to pt address noted in the chart to advise they are overdue for cpe/ov/labs and the pt needs to contact office to set up appt   

## 2011-08-18 NOTE — Telephone Encounter (Signed)
Refill: Zetia 10mg  tablet. Take 1 tablet by mouth daily. Qty 30. Last fill 06-20-11

## 2011-08-23 ENCOUNTER — Encounter: Payer: Self-pay | Admitting: Gastroenterology

## 2011-08-23 ENCOUNTER — Telehealth: Payer: Self-pay | Admitting: Family Medicine

## 2011-08-23 DIAGNOSIS — Z1211 Encounter for screening for malignant neoplasm of colon: Secondary | ICD-10-CM

## 2011-08-23 DIAGNOSIS — Z780 Asymptomatic menopausal state: Secondary | ICD-10-CM

## 2011-08-23 NOTE — Telephone Encounter (Signed)
Placed referral in chart for GI apt per they will have to schedule the colonoscopy and placed referral for dexa scan, called pt to advise that someone will call her about both apts once available, pt understood and wants call back on cell number [phone]

## 2011-08-23 NOTE — Telephone Encounter (Signed)
Pt states she is due for her colonoscopy. She has not had one in 10 years and she states she needs one this year. 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 She also states she is due for a bone density scan, and again, would like this done during the summer. Call back # [phone]

## 2011-09-28 ENCOUNTER — Ambulatory Visit (AMBULATORY_SURGERY_CENTER): Payer: BC Managed Care – PPO | Admitting: *Deleted

## 2011-09-28 ENCOUNTER — Telehealth: Payer: Self-pay | Admitting: *Deleted

## 2011-09-28 VITALS — Ht 63.0 in | Wt 132.0 lb

## 2011-09-28 DIAGNOSIS — Z1211 Encounter for screening for malignant neoplasm of colon: Secondary | ICD-10-CM

## 2011-09-28 NOTE — Telephone Encounter (Signed)
Left message on voice mail that colon would be due 2014. Colonoscopy Recall for 2014 entered into computer.

## 2011-09-28 NOTE — Telephone Encounter (Signed)
Next year please

## 2011-09-28 NOTE — Progress Notes (Signed)
Last colonoscopy 2004 with Dr. Leone Payor.  Normal except for hemorrhoids.  No GI complaints, no family hx of colon cancer.  I will send not to Dr. Leone Payor to see if pt is due for screening colonoscopy now or 2014.  Will notify pt after Dr. Leone Payor reviews note.

## 2011-09-28 NOTE — Telephone Encounter (Signed)
Dr. Leone Payor:  Pt had screening colonoscopy 2004 with you.  Findings: External hemorrhoids only.  Here today for PV for recall colon.  She is not having any GI issues and no family hx of colon cancer.  Is she due for colonoscopy 2014?  Pt willing to wait one more year. Please advise.

## 2011-10-16 ENCOUNTER — Ambulatory Visit (INDEPENDENT_AMBULATORY_CARE_PROVIDER_SITE_OTHER): Payer: BC Managed Care – PPO | Admitting: Family Medicine

## 2011-10-16 ENCOUNTER — Encounter: Payer: Self-pay | Admitting: Family Medicine

## 2011-10-16 ENCOUNTER — Other Ambulatory Visit: Payer: Self-pay | Admitting: Family Medicine

## 2011-10-16 ENCOUNTER — Ambulatory Visit (HOSPITAL_BASED_OUTPATIENT_CLINIC_OR_DEPARTMENT_OTHER)
Admission: RE | Admit: 2011-10-16 | Discharge: 2011-10-16 | Disposition: A | Discharge status: Home / Self Care | Payer: BC Managed Care – PPO | Source: Ambulatory Visit | Attending: Family Medicine | Admitting: Family Medicine

## 2011-10-16 VITALS — BP 122/72 | HR 76 | Temp 98.2°F | Wt 132.0 lb

## 2011-10-16 DIAGNOSIS — M25569 Pain in unspecified knee: Secondary | ICD-10-CM

## 2011-10-16 MED ORDER — MELOXICAM 15 MG PO TABS: ORAL_TABLET | ORAL | Status: DC

## 2011-10-16 NOTE — Progress Notes (Signed)
  Subjective:    Gabrielle Snow is a 61 y.o. female who presents with knee swelling involving the left knee. Onset was gradual, starting about 2 weeks ago. Inciting event: none known. Current symptoms include: stiffness and swelling. Pain is aggravated by lateral movements and rising after sitting. Patient has had no prior knee problems. Evaluation to date: none. Treatment to date: OTC analgesics which are somewhat effective.  The following portions of the patient's history were reviewed and updated as appropriate: allergies, current medications, past family history, past medical history, past social history, past surgical history and problem list.   Review of Systems Pertinent items are noted in HPI.   Objective:    BP 122/72  Pulse 76  Temp 98.2 F (36.8 C) (Oral)  Wt 132 lb (59.875 kg)  SpO2 97% Right knee: normal and no effusion, full active range of motion, no joint line tenderness, ligamentous structures intact.  Left knee:  positive exam findings: effusion and swelling laterally and negative exam findings: no erythema and no tenderness   X-ray left knee: not available    Assessment:    Left pain    Plan:    Educational materials distributed. Rest, ice, compression, and elevation (RICE) therapy. Reduction in offending activity. NSAIDs per medication orders.

## 2011-10-16 NOTE — Patient Instructions (Addendum)
Knee Pain The knee is the complex joint between your thigh and your lower leg. It is made up of bones, tendons, ligaments, and cartilage. The bones that make up the knee are:  The femur in the thigh.   The tibia and fibula in the lower leg.   The patella or kneecap riding in the groove on the lower femur.  CAUSES  Knee pain is a common complaint with many causes. A few of these causes are:  Injury, such as:   A ruptured ligament or tendon injury.   Torn cartilage.   Medical conditions, such as:   Gout   Arthritis   Infections   Overuse, over training or overdoing a physical activity.  Knee pain can be minor or severe. Knee pain can accompany debilitating injury. Minor knee problems often respond well to self-care measures or get well on their own. More serious injuries may need medical intervention or even surgery. SYMPTOMS The knee is complex. Symptoms of knee problems can vary widely. Some of the problems are:  Pain with movement and weight bearing.   Swelling and tenderness.   Buckling of the knee.   Inability to straighten or extend your knee.   Your knee locks and you cannot straighten it.   Warmth and redness with pain and fever.   Deformity or dislocation of the kneecap.  DIAGNOSIS  Determining what is wrong may be very straight forward such as when there is an injury. It can also be challenging because of the complexity of the knee. Tests to make a diagnosis may include:  Your caregiver taking a history and doing a physical exam.   Routine X-rays can be used to rule out other problems. X-rays will not reveal a cartilage tear. Some injuries of the knee can be diagnosed by:   Arthroscopy a surgical technique by which a small video camera is inserted through tiny incisions on the sides of the knee. This procedure is used to examine and repair internal knee joint problems. Tiny instruments can be used during arthroscopy to repair the torn knee cartilage  (meniscus).   Arthrography is a radiology technique. A contrast liquid is directly injected into the knee joint. Internal structures of the knee joint then become visible on X-ray film.   An MRI scan is a non x-ray radiology procedure in which magnetic fields and a computer produce two- or three-dimensional images of the inside of the knee. Cartilage tears are often visible using an MRI scanner. MRI scans have largely replaced arthrography in diagnosing cartilage tears of the knee.   Blood work.   Examination of the fluid that helps to lubricate the knee joint (synovial fluid). This is done by taking a sample out using a needle and a syringe.  TREATMENT The treatment of knee problems depends on the cause. Some of these treatments are:  Depending on the injury, proper casting, splinting, surgery or physical therapy care will be needed.   Give yourself adequate recovery time. Do not overuse your joints. If you begin to get sore during workout routines, back off. Slow down or do fewer repetitions.   For repetitive activities such as cycling or running, maintain your strength and nutrition.   Alternate muscle groups. For example if you are a weight lifter, work the upper body on one day and the lower body the next.   Either tight or weak muscles do not give the proper support for your knee. Tight or weak muscles do not absorb the stress placed   on the knee joint. Keep the muscles surrounding the knee strong.   Take care of mechanical problems.   If you have flat feet, orthotics or special shoes may help. See your caregiver if you need help.   Arch supports, sometimes with wedges on the inner or outer aspect of the heel, can help. These can shift pressure away from the side of the knee most bothered by osteoarthritis.   A brace called an "unloader" brace also may be used to help ease the pressure on the most arthritic side of the knee.   If your caregiver has prescribed crutches, braces,  wraps or ice, use as directed. The acronym for this is PRICE. This means protection, rest, ice, compression and elevation.   Nonsteroidal anti-inflammatory drugs (NSAID's), can help relieve pain. But if taken immediately after an injury, they may actually increase swelling. Take NSAID's with food in your stomach. Stop them if you develop stomach problems. Do not take these if you have a history of ulcers, stomach pain or bleeding from the bowel. Do not take without your caregiver's approval if you have problems with fluid retention, heart failure, or kidney problems.   For ongoing knee problems, physical therapy may be helpful.   Glucosamine and chondroitin are over-the-counter dietary supplements. Both may help relieve the pain of osteoarthritis in the knee. These medicines are different from the usual anti-inflammatory drugs. Glucosamine may decrease the rate of cartilage destruction.   Injections of a corticosteroid drug into your knee joint may help reduce the symptoms of an arthritis flare-up. They may provide pain relief that lasts a few months. You may have to wait a few months between injections. The injections do have a small increased risk of infection, water retention and elevated blood sugar levels.   Hyaluronic acid injected into damaged joints may ease pain and provide lubrication. These injections may work by reducing inflammation. A series of shots may give relief for as long as 6 months.   Topical painkillers. Applying certain ointments to your skin may help relieve the pain and stiffness of osteoarthritis. Ask your pharmacist for suggestions. Many over the-counter products are approved for temporary relief of arthritis pain.   In some countries, doctors often prescribe topical NSAID's for relief of chronic conditions such as arthritis and tendinitis. A review of treatment with NSAID creams found that they worked as well as oral medications but without the serious side effects.    PREVENTION  Maintain a healthy weight. Extra pounds put more strain on your joints.   Get strong, stay limber. Weak muscles are a common cause of knee injuries. Stretching is important. Include flexibility exercises in your workouts.   Be smart about exercise. If you have osteoarthritis, chronic knee pain or recurring injuries, you may need to change the way you exercise. This does not mean you have to stop being active. If your knees ache after jogging or playing basketball, consider switching to swimming, water aerobics or other low-impact activities, at least for a few days a week. Sometimes limiting high-impact activities will provide relief.   Make sure your shoes fit well. Choose footwear that is right for your sport.   Protect your knees. Use the proper gear for knee-sensitive activities. Use kneepads when playing volleyball or laying carpet. Buckle your seat belt every time you drive. Most shattered kneecaps occur in car accidents.   Rest when you are tired.  SEEK MEDICAL CARE IF:  You have knee pain that is continual and does not   seem to be getting better.  SEEK IMMEDIATE MEDICAL CARE IF:  Your knee joint feels hot to the touch and you have a high fever. MAKE SURE YOU:   Understand these instructions.   Will watch your condition.   Will get help right away if you are not doing well or get worse.  Document Released: 12/25/2006 Document Revised: 02/16/2011 Document Reviewed: 12/25/2006 [employer] Patient Information 2012 [employer]. 

## 2011-10-20 ENCOUNTER — Encounter: Payer: BC Managed Care – PPO | Admitting: Gastroenterology

## 2011-10-24 ENCOUNTER — Ambulatory Visit (INDEPENDENT_AMBULATORY_CARE_PROVIDER_SITE_OTHER): Payer: BC Managed Care – PPO | Admitting: Family Medicine

## 2011-10-24 ENCOUNTER — Encounter: Payer: Self-pay | Admitting: Family Medicine

## 2011-10-24 VITALS — BP 124/78 | HR 71 | Temp 98.0°F | Ht 62.5 in | Wt 130.0 lb

## 2011-10-24 DIAGNOSIS — E559 Vitamin D deficiency, unspecified: Secondary | ICD-10-CM

## 2011-10-24 DIAGNOSIS — Z0000 Encounter for general adult medical examination without abnormal findings: Secondary | ICD-10-CM

## 2011-10-24 DIAGNOSIS — E785 Hyperlipidemia, unspecified: Secondary | ICD-10-CM

## 2011-10-24 LAB — CBC WITH DIFFERENTIAL/PLATELET
Basophils Absolute: 0 10*3/uL (ref 0.0–0.1)
Eosinophils Relative: 3.9 % (ref 0.0–5.0)
HCT: 38.3 % (ref 36.0–46.0)
Lymphs Abs: 1.3 10*3/uL (ref 0.7–4.0)
MCV: 89 fl (ref 78.0–100.0)
Monocytes Absolute: 0.4 10*3/uL (ref 0.1–1.0)
Monocytes Relative: 9.6 % (ref 3.0–12.0)
Neutrophils Relative %: 57.2 % (ref 43.0–77.0)
Platelets: 201 10*3/uL (ref 150.0–400.0)
RDW: 13.6 % (ref 11.5–14.6)
WBC: 4.6 10*3/uL (ref 4.5–10.5)

## 2011-10-24 LAB — HEPATIC FUNCTION PANEL
AST: 20 U/L (ref 0–37)
Alkaline Phosphatase: 68 U/L (ref 39–117)
Bilirubin, Direct: 0.1 mg/dL (ref 0.0–0.3)

## 2011-10-24 LAB — LIPID PANEL
HDL: 43 mg/dL (ref >39.00)
LDL Cholesterol: 110 mg/dL — ABNORMAL HIGH (ref 0–99)
Total CHOL/HDL Ratio: 4
Triglycerides: 58 mg/dL (ref 0.0–149.0)
VLDL: 11.6 mg/dL (ref 0.0–40.0)

## 2011-10-24 LAB — BASIC METABOLIC PANEL
Calcium: 9.1 mg/dL (ref 8.4–10.5)
GFR: 90.6 mL/min (ref >60.00)
Potassium: 4.5 mEq/L (ref 3.5–5.1)
Sodium: 135 mEq/L (ref 135–145)

## 2011-10-24 NOTE — Progress Notes (Signed)
  Subjective:    Patient ID: Gabrielle Snow, female    DOB: 1950-10-26, 61 y.o.   MRN: [id number]  HPI CPE-  UTD on GYN.  No concerns today.  Due for colonoscopy next year.   Review of Systems Patient reports no vision/ hearing changes, adenopathy,fever, weight change,  persistant/recurrent hoarseness , swallowing issues, chest pain, palpitations, edema, persistant/recurrent cough, hemoptysis, dyspnea (rest/exertional/paroxysmal nocturnal), gastrointestinal bleeding (melena, rectal bleeding), abdominal pain, significant heartburn, bowel changes, GU symptoms (dysuria, hematuria, incontinence), Gyn symptoms (abnormal  bleeding, pain),  syncope, focal weakness, memory loss, numbness & tingling, skin/hair/nail changes, abnormal bruising or bleeding, anxiety, or depression.     Objective:   Physical Exam General Appearance:    Alert, cooperative, no distress, appears stated age  Head:    Normocephalic, without obvious abnormality, atraumatic  Eyes:    PERRL, conjunctiva/corneas clear, EOM's intact, fundi    benign, both eyes  Ears:    Normal TM's and external ear canals, both ears  Nose:   Nares normal, septum midline, mucosa normal, no drainage    or sinus tenderness  Throat:   Lips, mucosa, and tongue normal; teeth and gums normal  Neck:   Supple, symmetrical, trachea midline, no adenopathy;    Thyroid: no enlargement/tenderness/nodules  Back:     Symmetric, no curvature, ROM normal, no CVA tenderness  Lungs:     Clear to auscultation bilaterally, respirations unlabored  Chest Wall:    No tenderness or deformity   Heart:    Regular rate and rhythm, S1 and S2 normal, no murmur, rub   or gallop  Breast Exam:    Deferred to GYN  Abdomen:     Soft, non-tender, bowel sounds active all four quadrants,    no masses, no organomegaly  Genitalia:    Deferred to GYN  Rectal:    Extremities:   Extremities normal, atraumatic, no cyanosis or edema  Pulses:   2+ and symmetric all extremities    Skin:   Skin color, texture, turgor normal, no rashes or lesions  Lymph nodes:   Cervical, supraclavicular, and axillary nodes normal  Neurologic:   CNII-XII intact, normal strength, sensation and reflexes    throughout          Assessment & Plan:

## 2011-10-24 NOTE — Assessment & Plan Note (Signed)
Pt's PE WNL.  UTD on health maintenance.  Check labs.  Anticipatory guidance provided.  

## 2011-10-24 NOTE — Assessment & Plan Note (Signed)
Check labs.  Adjust meds prn  

## 2011-10-24 NOTE — Patient Instructions (Addendum)
Follow up in 6 months to recheck cholesterol We'll notify you of your lab results and make any changes if needed Keep up the good work!  You look great! Call with any questions or concerns Hang in there!!! 

## 2011-10-25 ENCOUNTER — Encounter: Payer: Self-pay | Admitting: *Deleted

## 2011-10-26 ENCOUNTER — Other Ambulatory Visit: Payer: Self-pay

## 2011-10-27 ENCOUNTER — Other Ambulatory Visit: Payer: Self-pay | Admitting: *Deleted

## 2011-10-27 LAB — VITAMIN D 1,25 DIHYDROXY: Vitamin D 1, 25 (OH)2 Total: 28 pg/mL (ref 18–72)

## 2011-10-27 MED ORDER — EZETIMIBE 10 MG PO TABS: 10.0000 mg | ORAL_TABLET | Freq: Every day | ORAL | Status: DC

## 2011-10-27 NOTE — Telephone Encounter (Signed)
Rx sent 

## 2011-11-07 ENCOUNTER — Telehealth: Payer: Self-pay | Admitting: Family Medicine

## 2011-11-07 NOTE — Telephone Encounter (Signed)
Caller: Sheronda/Patient; Patient Name: Gabrielle Snow; PCP: Sheliah Hatch.; Best Callback Phone Number: [phone]. States that someone from office called her phone approximately 5 times today. Was wondering if it was regarding bone density testing. States that there was also a message left on her home answering machine "the other day" regarding lab work results. States that she did receive a letter in the mail regarding labs. Was given all results except for her Bone Density Testing. Is confused and irritated that she can "never get anyone in the office and they left me voicemails to call back and when I call back I get you and can't speak to whomever called me." No record of any calls to patient in EPIC. Was told by Peri Jefferson, RN to send note to office for them to follow up. Patient request to PLEASE CALL HER CELL PHONE AFTER 3PM AND IF UNABLE TO REACH, PLEASE LEAVE A NUMBER WHERE SHE CAN ACTUALLY SPEAK TO SOMEONE WHEN SHE CALLS BACK. 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 Gets bad cell phone reception in school and calls don't always go through on her cell. Please advise.

## 2011-11-07 NOTE — Telephone Encounter (Signed)
Called pt to advise phone Vit D results per noted in EPIC at lab results window, advise pt best number to call back to this nurse personal extension, pt requested bone density results noted pt new DX of Osteopenia and instructions advise for pt to take Ca1200 and 800 Vit D, however per results from low Vit D state for pt to take 1000mg  daily, pt notes she currently takes Vit D and calcium in her MV, this nurse will contact pt back tomorrow after 3pm to clarify current dosage of vit D and calcium in current supplement per not at home at this time and unaware of current dosage, please note

## 2011-11-07 NOTE — Telephone Encounter (Signed)
Amount in multivitamin is not usually enough for a woman w/ osteopenia- which is why we recommend the 2 caltrate daily

## 2011-11-08 ENCOUNTER — Encounter: Payer: Self-pay | Admitting: Family Medicine

## 2011-11-08 NOTE — Telephone Encounter (Signed)
.  left message to have patient return my call on mobile and with family member at home,

## 2011-11-14 NOTE — Telephone Encounter (Signed)
Spoke to pt to advise results/instructions. Pt understood. Pt will now start taking the Caltrate OTC 2 tablets daily

## 2012-02-27 ENCOUNTER — Other Ambulatory Visit: Payer: Self-pay | Admitting: Family Medicine

## 2012-02-27 NOTE — Telephone Encounter (Signed)
refill Zolpidem Tartrate (Tab CR) CR 6.25 MG Take 1 tablet (6.25 mg total) by mouth at bedtime as needed for sleep --last fill 6.5.13, last ov wt/labs 8.13.13 V70.9

## 2012-02-27 NOTE — Telephone Encounter (Signed)
Please advise on RF request.//AB/CMA 

## 2012-02-28 ENCOUNTER — Other Ambulatory Visit: Payer: Self-pay | Admitting: *Deleted

## 2012-02-28 DIAGNOSIS — G4700 Insomnia, unspecified: Secondary | ICD-10-CM

## 2012-02-28 MED ORDER — ZOLPIDEM TARTRATE ER 6.25 MG PO TBCR: 6.2500 mg | EXTENDED_RELEASE_TABLET | Freq: Every evening | ORAL | Status: DC | PRN

## 2012-02-28 NOTE — Telephone Encounter (Signed)
Ok for #30, 1 refill (needs to sign controlled substance agreement) 

## 2012-03-01 NOTE — Telephone Encounter (Signed)
LM @ (9:49am) asking the pt to RTC//AB/CMA

## 2012-03-01 NOTE — Telephone Encounter (Signed)
Spoke with the pt and informed her that the rx she requested is ready, and we need her to pick it up and sign an agreement.  Pt agreed.//AB/CMA

## 2012-04-29 ENCOUNTER — Encounter: Payer: Self-pay | Admitting: Family Medicine

## 2012-09-04 ENCOUNTER — Encounter: Payer: Self-pay | Admitting: Family Medicine

## 2012-09-04 ENCOUNTER — Ambulatory Visit (INDEPENDENT_AMBULATORY_CARE_PROVIDER_SITE_OTHER): Payer: BC Managed Care – PPO | Admitting: Family Medicine

## 2012-09-04 VITALS — BP 130/80 | HR 90 | Temp 98.7°F | Ht 62.75 in | Wt 135.8 lb

## 2012-09-04 DIAGNOSIS — H6120 Impacted cerumen, unspecified ear: Secondary | ICD-10-CM

## 2012-09-04 DIAGNOSIS — J0100 Acute maxillary sinusitis, unspecified: Secondary | ICD-10-CM | POA: Insufficient documentation

## 2012-09-04 DIAGNOSIS — H6122 Impacted cerumen, left ear: Secondary | ICD-10-CM

## 2012-09-04 HISTORY — DX: Acute maxillary sinusitis, unspecified: J01.00

## 2012-09-04 MED ORDER — AMOXICILLIN 875 MG PO TABS: 875.0000 mg | ORAL_TABLET | Freq: Two times a day (BID) | ORAL | Status: DC

## 2012-09-04 NOTE — Assessment & Plan Note (Signed)
New.  Pt's sxs and PE consistent w/ infxn.  Start abx.  Reviewed supportive care and red flags that should prompt return.  Pt expressed understanding and is in agreement w/ plan.  

## 2012-09-04 NOTE — Patient Instructions (Addendum)
This is a sinus infection Start the Amoxicillin twice daily- take w/ food Drink plenty of fluids REST! Alternate tylenol and ibuprofen for pain relief Mucinex to keep your congestion thin Call with any questions or concerns Have a great time at the beach!!

## 2012-09-04 NOTE — Progress Notes (Signed)
  Subjective:    Patient ID: Gabrielle Snow, female    DOB: [birth year], 62 y.o.   MRN: [id number]  HPI    Review of Systems     Objective:   Physical Exam L TM obscured by soft, copious wax- easily removed w/ curette       Assessment & Plan:

## 2012-09-04 NOTE — Assessment & Plan Note (Signed)
New.  Wax removed w/ curette.  Pt tolerated w/out difficulty.

## 2012-09-04 NOTE — Progress Notes (Signed)
  Subjective:    Patient ID: Gabrielle Snow, female    DOB: 1950-06-26, 62 y.o.   MRN: [id number]  HPI URI- + nasal congestion, facial pressure.  + HA x3 days.  Started Mucinex w/out relief.  No fevers.  'i feel awful'.  + body aches, L ear fullness, sore throat.  No cough.  + sick contacts.  No N/V/D.   Review of Systems For ROS see HPI     Objective:   Physical Exam  Vitals reviewed. Constitutional: She appears well-developed and well-nourished. No distress.  HENT:  Head: Normocephalic and atraumatic.  Right Ear: Tympanic membrane normal.  Left Ear: Tympanic membrane normal.  Nose: Mucosal edema and rhinorrhea present. Right sinus exhibits maxillary sinus tenderness and frontal sinus tenderness. Left sinus exhibits maxillary sinus tenderness and frontal sinus tenderness.  Mouth/Throat: Uvula is midline and mucous membranes are normal. Posterior oropharyngeal erythema present. No oropharyngeal exudate.  Eyes: Conjunctivae and EOM are normal. Pupils are equal, round, and reactive to light.  Neck: Normal range of motion. Neck supple.  Cardiovascular: Normal rate, regular rhythm and normal heart sounds.   Pulmonary/Chest: Effort normal and breath sounds normal. No respiratory distress. She has no wheezes.  Lymphadenopathy:    She has no cervical adenopathy.          Assessment & Plan:

## 2012-09-12 ENCOUNTER — Encounter: Payer: Self-pay | Admitting: Internal Medicine

## 2012-09-17 ENCOUNTER — Telehealth: Payer: Self-pay | Admitting: Family Medicine

## 2012-09-17 NOTE — Telephone Encounter (Signed)
Patient Information:  Caller Name: Chrysta  Phone: [phone]  Patient: Gabrielle Snow, Gabrielle Snow  Gender: Female  DOB: [birth year]  Age: 62 Years  PCP: Sheliah Hatch.  Office Follow Up:  Does the office need to follow up with this patient?: No  Instructions For The Office: N/A  RN Note:  Left ear pressure and congestion with decreased hearing. No ear pain. Sinus symptoms resolved. Stopped Mucinex. Currently in [place].   Requested appointement for 09/19/12 so transferred to office scheduler/Adera who scheduled for 1100 09/19/12 with Dr Beverely Low. .   Symptoms  Reason For Call & Symptoms: Ongoing left ear congestion following 10 day of Amoxicillin for sinus infection  Reviewed Health History In EMR: Yes  Reviewed Medications In EMR: Yes  Reviewed Allergies In EMR: Yes  Reviewed Surgeries / Procedures: Yes  Date of Onset of Symptoms: 09/04/2012  Guideline(s) Used:  Ear - Congestion  Disposition Per Guideline:   See Within 3 Days in Office  Reason For Disposition Reached:   Ear congestion present > 48 hours  Advice Given:  Reassurance:  Definition: Ear congestion is the medical term used to describe symptoms of a stuffy, full, or plugged sensation in the ear. People also sometimes describe crackling or popping noises in the ear. Sometimes hearing seems slightly muffled.  Eustacian tube: There is a small collapsible tube that runs between the middle ear and the nose. Normally, it permits tiny amounts of air to move in and out of the middle ear. When the tube gets blocked, air or fluid can build up behind the ear drum (tympanic membrane). This causes the symptoms of ear congestion.  Here is some care advice that should help.  Causes  Blowing the nose too hard can also push air and fluid into the eustachian tube.  Treatment - Chewing and Swallowing:   Try chewing gum.  You can also try swallowing water while pinching your nostrils closed. The reason this works is that it creates a  small vacuum in the nose. This helps the eustachian tube to open up.  Caution - Nasal Decongestants:  Do not take these medications if you have high blood pressure, heart disease, prostate problems, or an overactive thyroid.  Do not use these medications for more than 3 days (Reason: rebound nasal congestion).  Call Back If:   Ear congestion lasts over 48 hours  Ear pain or fever occurs  You become worse.  Patient Will Follow Care Advice:  YES

## 2012-09-17 NOTE — Telephone Encounter (Signed)
Pt with pending appt 09-19-12

## 2012-09-19 ENCOUNTER — Encounter: Payer: Self-pay | Admitting: Internal Medicine

## 2012-09-19 ENCOUNTER — Encounter: Payer: Self-pay | Admitting: Family Medicine

## 2012-09-19 ENCOUNTER — Ambulatory Visit (INDEPENDENT_AMBULATORY_CARE_PROVIDER_SITE_OTHER): Payer: BC Managed Care – PPO | Admitting: Family Medicine

## 2012-09-19 VITALS — BP 122/82 | HR 74 | Temp 98.9°F

## 2012-09-19 DIAGNOSIS — H6120 Impacted cerumen, unspecified ear: Secondary | ICD-10-CM

## 2012-09-19 DIAGNOSIS — H6122 Impacted cerumen, left ear: Secondary | ICD-10-CM

## 2012-09-19 NOTE — Assessment & Plan Note (Signed)
Recurrent problem.  Wax pressed up against TM which is likely cause of pt's decreased hearing.  Pt reports hearing normalized after wax removal.  Pt very pleased.

## 2012-09-19 NOTE — Progress Notes (Signed)
  Subjective:    Patient ID: Gabrielle Snow, female    DOB: [birth year], 62 y.o.   MRN: [id number]  HPI Ear pain- pt has been on vacation x2.5 weeks and reports she's unable to hear out of L ear 'at all'.  + ear pressure.  + nasal congestion.  Treated for sinus infxn end of June w/ good results.  Pt fears she's going deaf.   Review of Systems For ROS see HPI     Objective:   Physical Exam  Vitals reviewed. Constitutional: She appears well-developed and well-nourished. No distress.  HENT:  Head: Normocephalic and atraumatic.  Nose: Nose normal.  Mouth/Throat: Oropharynx is clear and moist.  R ear canal clear and TM WNL L canal full of soft cerumen, complete obscuring TM.  Some wax able to be curetted but majority required irrigation.  Canal now completely clear and TM WNL.  Hearing WNL.          Assessment & Plan:

## 2012-10-16 ENCOUNTER — Encounter: Payer: Self-pay | Admitting: Women's Health

## 2012-10-16 ENCOUNTER — Ambulatory Visit (INDEPENDENT_AMBULATORY_CARE_PROVIDER_SITE_OTHER): Payer: BC Managed Care – PPO | Admitting: Women's Health

## 2012-10-16 ENCOUNTER — Other Ambulatory Visit (HOSPITAL_COMMUNITY)
Admission: RE | Admit: 2012-10-16 | Discharge: 2012-10-16 | Disposition: A | Discharge status: Home / Self Care | Payer: BC Managed Care – PPO | Source: Ambulatory Visit | Attending: Gynecology | Admitting: Gynecology

## 2012-10-16 VITALS — BP 110/62 | Ht 63.0 in | Wt 134.0 lb

## 2012-10-16 DIAGNOSIS — Z01419 Encounter for gynecological examination (general) (routine) without abnormal findings: Secondary | ICD-10-CM

## 2012-10-16 NOTE — Patient Instructions (Signed)
[facility] Dermatology  Dr Virgina Norfolk, [facility] Health Recommendations for Postmenopausal Women Respected and ongoing research has looked at the most common causes of death, disability, and poor quality of life in postmenopausal women. The causes include heart disease, diseases of blood vessels, diabetes, depression, cancer, and bone loss (osteoporosis). Many things can be done to help lower the chances of developing these and other common problems: CARDIOVASCULAR DISEASE Heart Disease: A heart attack is a medical emergency. Know the signs and symptoms of a heart attack. Below are things women can do to reduce their risk for heart disease.   Do not smoke. If you smoke, quit.  Aim for a healthy weight. Being overweight causes many preventable deaths. Eat a healthy and balanced diet and drink an adequate amount of liquids.  Get moving. Make a commitment to be more physically active. Aim for 30 minutes of activity on most, if not all days of the week.  Eat for heart health. Choose a diet that is low in saturated fat and cholesterol and eliminate trans fat. Include whole grains, vegetables, and fruits. Read and understand the labels on food containers before buying.  Know your numbers. Ask your caregiver to check your blood pressure, cholesterol (total, HDL, LDL, triglycerides) and blood glucose. Work with your caregiver on improving your entire clinical picture.  High blood pressure. Limit or stop your table salt intake (try salt substitute and food seasonings). Avoid salty foods and drinks. Read labels on food containers before buying. Eating well and exercising can help control high blood pressure. STROKE  Stroke is a medical emergency. Stroke may be the result of a blood clot in a blood vessel in the brain or by a brain hemorrhage (bleeding). Know the signs and symptoms of a stroke. To lower the risk of developing a stroke:  Avoid fatty foods.  Quit smoking.  Control your diabetes, blood  pressure, and irregular heart rate. THROMBOPHLEBITIS (BLOOD CLOT) OF THE LEG  Becoming overweight and leading a stationary lifestyle may also contribute to developing blood clots. Controlling your diet and exercising will help lower the risk of developing blood clots. CANCER SCREENING  Breast Cancer: Take steps to reduce your risk of breast cancer.  You should practice "breast self-awareness." This means understanding the normal appearance and feel of your breasts and should include breast self-examination. Any changes detected, no matter how small, should be reported to your caregiver.  After age 34, you should have a clinical breast exam (CBE) every year.  Starting at age 36, you should consider having a mammogram (breast X-ray) every year.  If you have a family history of breast cancer, talk to your caregiver about genetic screening.  If you are at high risk for breast cancer, talk to your caregiver about having an MRI and a mammogram every year.  Intestinal or Stomach Cancer: Tests to consider are a rectal exam, fecal occult blood, sigmoidoscopy, and colonoscopy. Women who are high risk may need to be screened at an earlier age and more often.  Cervical Cancer:  Beginning at age 57, you should have a Pap test every 3 years as long as the past 3 Pap tests have been normal.  If you have had past treatment for cervical cancer or a condition that could lead to cancer, you need Pap tests and screening for cancer for at least 20 years after your treatment.  If you had a hysterectomy for a problem that was not cancer or a condition that could lead to cancer, then  you no longer need Pap tests.  If you are between ages 32 and 75, and you have had normal Pap tests going back 10 years, you no longer need Pap tests.  If Pap tests have been discontinued, risk factors (such as a new sexual partner) need to be reassessed to determine if screening should be resumed.  Some medical problems can  increase the chance of getting cervical cancer. In these cases, your caregiver may recommend more frequent screening and Pap tests.  Uterine Cancer: If you have vaginal bleeding after reaching menopause, you should notify your caregiver.  Ovarian cancer: Other than yearly pelvic exams, there are no reliable tests available to screen for ovarian cancer at this time except for yearly pelvic exams.  Lung Cancer: Yearly chest X-rays can detect lung cancer and should be done on high risk women, such as cigarette smokers and women with chronic lung disease (emphysema).  Skin Cancer: A complete body skin exam should be done at your yearly examination. Avoid overexposure to the sun and ultraviolet light lamps. Use a strong sun block cream when in the sun. All of these things are important in lowering the risk of skin cancer. MENOPAUSE Menopause Symptoms: Hormone therapy products are effective for treating symptoms associated with menopause:  Moderate to severe hot flashes.  Night sweats.  Mood swings.  Headaches.  Tiredness.  Loss of sex drive.  Insomnia.  Other symptoms. Hormone replacement carries certain risks, especially in older women. Women who use or are thinking about using estrogen or estrogen with progestin treatments should discuss that with their caregiver. Your caregiver will help you understand the benefits and risks. The ideal dose of hormone replacement therapy is not known. The Food and Drug Administration (FDA) has concluded that hormone therapy should be used only at the lowest doses and for the shortest amount of time to reach treatment goals.  OSTEOPOROSIS Protecting Against Bone Loss and Preventing Fracture: If you use hormone therapy for prevention of bone loss (osteoporosis), the risks for bone loss must outweigh the risk of the therapy. Ask your caregiver about other medications known to be safe and effective for preventing bone loss and fractures. To guard against bone  loss or fractures, the following is recommended:  If you are less than age 28, take 1000 mg of calcium and at least 600 mg of Vitamin D per day.  If you are greater than age 33 but less than age 4, take 1200 mg of calcium and at least 600 mg of Vitamin D per day.  If you are greater than age 71, take 1200 mg of calcium and at least 800 mg of Vitamin D per day. Smoking and excessive alcohol intake increases the risk of osteoporosis. Eat foods rich in calcium and vitamin D and do weight bearing exercises several times a week as your caregiver suggests. DIABETES Diabetes Melitus: If you have Type I or Type 2 diabetes, you should keep your blood sugar under control with diet, exercise and recommended medication. Avoid too many sweets, starchy and fatty foods. Being overweight can make control more difficult. COGNITION AND MEMORY Cognition and Memory: Menopausal hormone therapy is not recommended for the prevention of cognitive disorders such as Alzheimer's disease or memory loss.  DEPRESSION  Depression may occur at any age, but is common in elderly women. The reasons may be because of physical, medical, social (loneliness), or financial problems and needs. If you are experiencing depression because of medical problems and control of symptoms, talk to  your caregiver about this. Physical activity and exercise may help with mood and sleep. Community and volunteer involvement may help your sense of value and worth. If you have depression and you feel that the problem is getting worse or becoming severe, talk to your caregiver about treatment options that are best for you. ACCIDENTS  Accidents are common and can be serious in the elderly woman. Prepare your house to prevent accidents. Eliminate throw rugs, place hand bars in the bath, shower and toilet areas. Avoid wearing high heeled shoes or walking on wet, snowy, and icy areas. Limit or stop driving if you have vision or hearing problems, or you feel you  are unsteady with you movements and reflexes. HEPATITIS C Hepatitis C is a type of viral infection affecting the liver. It is spread mainly through contact with blood from an infected person. It can be treated, but if left untreated, it can lead to severe liver damage over years. Many people who are infected do not know that the virus is in their blood. If you are a "baby-boomer", it is recommended that you have one screening test for Hepatitis C. IMMUNIZATIONS  Several immunizations are important to consider having during your senior years, including:   Tetanus, diptheria, and pertussis booster shot.  Influenza every year before the flu season begins.  Pneumonia vaccine.  Shingles vaccine.  Others as indicated based on your specific needs. Talk to your caregiver about these. Document Released: 04/21/2005 Document Revised: 02/14/2012 Document Reviewed: 12/16/2007 [facility] Patient Information 2014 [place], [place].

## 2012-10-16 NOTE — Progress Notes (Signed)
Gabrielle Snow 28-May-1950 [id number]    History:    New patient presents for annual exam.  Postmenopausal/no bleeding/no HRT. Benign left breast biopsy 1973 with normal mammogram history. Normal Pap history. Negative colonoscopy 2004 has scheduled colonoscopy September 2014. Sister with history of breast cancer at age 61 now age 75. DEXA reports minimal bone loss.   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 Has a 84 year old son. Father died of lung cancer, mother heart disease and hypertension died at 78.   ROS:  A  ROS was performed and pertinent positives and negatives are included in the history.  Exam:  Filed Vitals:   10/16/12 0901  BP: 110/62    General appearance:  Normal Head/Neck:  Normal, without cervical or supraclavicular adenopathy. Thyroid:  Symmetrical, normal in size, without palpable masses or nodularity. Respiratory  Effort:  Normal  Auscultation:  Clear without wheezing or rhonchi Cardiovascular  Auscultation:  Regular rate, without rubs, murmurs or gallops  Edema/varicosities:  Not grossly evident Abdominal  Soft,nontender, without masses, guarding or rebound.  Liver/spleen:  No organomegaly noted  Hernia:  None appreciated  Skin  Inspection:  Grossly normal  Palpation:  Grossly normal Neurologic/psychiatric  Orientation:  Normal with appropriate conversation.  Mood/affect:  Normal  Genitourinary    Breasts: Examined lying and sitting.     Right: Without masses, retractions, discharge or axillary adenopathy.     Left: Without masses, retractions, discharge or axillary adenopathy.   Inguinal/mons:  Normal without inguinal adenopathy  External genitalia:  Normal  BUS/Urethra/Skene's glands:  Normal  Bladder:  Normal  Vagina:  Normal  Cervix:  Normal stenotic  Uterus:   normal in  size, shape and contour.  Midline and mobile  Adnexa/parametria:     Rt: Without masses or tenderness.   Lt: Without masses or tenderness.  Anus and perineum: Normal  Digital rectal exam: Normal sphincter tone without palpated masses or tenderness  Assessment/Plan:  62 y.o. M. WF G3 P3 for annual exam with no complaints.  Normal postmenopausal exam/no HRT/no bleeding  Plan: SBE's, continue annual mammogram, calcium rich diet, vitamin D 2000 daily. Reviewed importance of increasing regular exercise for bone and general health. Labs at primary care, Pap only today. Will have records sent to our office.   Harrington Challenger WHNP, 10:07 AM 10/16/2012

## 2012-10-28 LAB — HM MAMMOGRAPHY: HM Mammogram: NORMAL

## 2012-10-29 ENCOUNTER — Encounter: Payer: Self-pay | Admitting: Family Medicine

## 2012-10-29 ENCOUNTER — Ambulatory Visit (INDEPENDENT_AMBULATORY_CARE_PROVIDER_SITE_OTHER): Payer: BC Managed Care – PPO | Admitting: Family Medicine

## 2012-10-29 VITALS — BP 110/78 | HR 80 | Temp 98.3°F | Ht 62.75 in | Wt 133.8 lb

## 2012-10-29 DIAGNOSIS — Z0000 Encounter for general adult medical examination without abnormal findings: Secondary | ICD-10-CM

## 2012-10-29 DIAGNOSIS — Z1331 Encounter for screening for depression: Secondary | ICD-10-CM

## 2012-10-29 LAB — CBC WITH DIFFERENTIAL/PLATELET
Basophils Relative: 0.6 % (ref 0.0–3.0)
Eosinophils Relative: 2 % (ref 0.0–5.0)
Hemoglobin: 12.4 g/dL (ref 12.0–15.0)
Lymphocytes Relative: 26.2 % (ref 12.0–46.0)
MCHC: 34.4 g/dL (ref 30.0–36.0)
Neutro Abs: 3.2 10*3/uL (ref 1.4–7.7)
Neutrophils Relative %: 61 % (ref 43.0–77.0)
RBC: 4.2 Mil/uL (ref 3.87–5.11)
WBC: 5.3 10*3/uL (ref 4.5–10.5)

## 2012-10-29 LAB — BASIC METABOLIC PANEL
BUN: 17 mg/dL (ref 6–23)
CO2: 28 mEq/L (ref 19–32)
Chloride: 104 mEq/L (ref 96–112)
Creatinine, Ser: 0.7 mg/dL (ref 0.4–1.2)
Glucose, Bld: 83 mg/dL (ref 70–99)
Potassium: 4.1 mEq/L (ref 3.5–5.1)

## 2012-10-29 LAB — LIPID PANEL
Cholesterol: 160 mg/dL (ref 0–200)
VLDL: 17 mg/dL (ref 0.0–40.0)

## 2012-10-29 LAB — HEPATIC FUNCTION PANEL
ALT: 16 U/L (ref 0–35)
AST: 18 U/L (ref 0–37)
Albumin: 4.2 g/dL (ref 3.5–5.2)
Total Protein: 8.1 g/dL (ref 6.0–8.3)

## 2012-10-29 LAB — TSH: TSH: 3.17 u[IU]/mL (ref 0.35–5.50)

## 2012-10-29 NOTE — Patient Instructions (Addendum)
Follow up in 6 months to recheck cholesterol We'll notify you of your lab results and make any changes if needed Call with any questions or concerns Good luck w/ back to school!!!

## 2012-10-29 NOTE — Progress Notes (Signed)
  Subjective:    Patient ID: Gabrielle Snow, female    DOB: [birth year], 62 y.o.   MRN: [id number]  HPI CPE- UTD on mammo, DEXA, pap.  Due for colonoscopy in Sept.   Review of Systems Patient reports no vision/ hearing changes, adenopathy,fever, weight change,  persistant/recurrent hoarseness , swallowing issues, chest pain, palpitations, edema, persistant/recurrent cough, hemoptysis, dyspnea (rest/exertional/paroxysmal nocturnal), gastrointestinal bleeding (melena, rectal bleeding), abdominal pain, significant heartburn, bowel changes, GU symptoms (dysuria, hematuria, incontinence), Gyn symptoms (abnormal  bleeding, pain),  syncope, focal weakness, memory loss, numbness & tingling, skin/hair/nail changes, abnormal bruising or bleeding, anxiety, or depression.     Objective:   Physical Exam General Appearance:    Alert, cooperative, no distress, appears stated age  Head:    Normocephalic, without obvious abnormality, atraumatic  Eyes:    PERRL, conjunctiva/corneas clear, EOM's intact, fundi    benign, both eyes  Ears:    Normal TM's and external ear canals, both ears  Nose:   Nares normal, septum midline, mucosa normal, no drainage    or sinus tenderness  Throat:   Lips, mucosa, and tongue normal; teeth and gums normal  Neck:   Supple, symmetrical, trachea midline, no adenopathy;    Thyroid: no enlargement/tenderness/nodules  Back:     Symmetric, no curvature, ROM normal, no CVA tenderness  Lungs:     Clear to auscultation bilaterally, respirations unlabored  Chest Wall:    No tenderness or deformity   Heart:    Regular rate and rhythm, S1 and S2 normal, no murmur, rub   or gallop  Breast Exam:    Deferred to GYN  Abdomen:     Soft, non-tender, bowel sounds active all four quadrants,    no masses, no organomegaly  Genitalia:    Deferred to GYN  Rectal:    Extremities:   Extremities normal, atraumatic, no cyanosis or edema  Pulses:   2+ and symmetric all extremities  Skin:    Skin color, texture, turgor normal, no rashes or lesions  Lymph nodes:   Cervical, supraclavicular, and axillary nodes normal  Neurologic:   CNII-XII intact, normal strength, sensation and reflexes    throughout          Assessment & Plan:

## 2012-10-29 NOTE — Assessment & Plan Note (Signed)
Pt's PE WNL.  UTD on GYN.  Has colonoscopy scheduled.  Check labs.  Anticipatory guidance provided.  

## 2012-10-30 ENCOUNTER — Encounter: Payer: Self-pay | Admitting: General Practice

## 2012-11-01 LAB — VITAMIN D 1,25 DIHYDROXY
Vitamin D 1, 25 (OH)2 Total: 35 pg/mL (ref 18–72)
Vitamin D2 1, 25 (OH)2: <8 pg/mL
Vitamin D3 1, 25 (OH)2: 35 pg/mL

## 2012-11-04 ENCOUNTER — Encounter: Payer: Self-pay | Admitting: *Deleted

## 2012-11-22 ENCOUNTER — Other Ambulatory Visit: Payer: Self-pay | Admitting: Family Medicine

## 2012-11-22 ENCOUNTER — Ambulatory Visit (AMBULATORY_SURGERY_CENTER): Payer: Self-pay | Admitting: *Deleted

## 2012-11-22 VITALS — Ht 63.5 in | Wt 134.8 lb

## 2012-11-22 DIAGNOSIS — Z1211 Encounter for screening for malignant neoplasm of colon: Secondary | ICD-10-CM

## 2012-11-22 MED ORDER — NA SULFATE-K SULFATE-MG SULF 17.5-3.13-1.6 GM/177ML PO SOLN: 1.0000 | Freq: Once | ORAL | Status: DC

## 2012-11-22 NOTE — Progress Notes (Signed)
No egg or soy allergy. No anesthesia problems.  

## 2012-11-25 ENCOUNTER — Encounter: Payer: Self-pay | Admitting: Internal Medicine

## 2012-11-25 NOTE — Telephone Encounter (Signed)
Rx filled and sent to pharmacy. SW, CMA 

## 2012-12-03 ENCOUNTER — Encounter: Payer: Self-pay | Admitting: *Deleted

## 2012-12-05 ENCOUNTER — Ambulatory Visit (AMBULATORY_SURGERY_CENTER): Payer: BC Managed Care – PPO | Admitting: Internal Medicine

## 2012-12-05 ENCOUNTER — Encounter: Payer: Self-pay | Admitting: Internal Medicine

## 2012-12-05 VITALS — BP 132/76 | HR 63 | Temp 97.7°F | Resp 18 | Ht 63.0 in | Wt 134.0 lb

## 2012-12-05 DIAGNOSIS — Z1211 Encounter for screening for malignant neoplasm of colon: Secondary | ICD-10-CM

## 2012-12-05 DIAGNOSIS — D126 Benign neoplasm of colon, unspecified: Secondary | ICD-10-CM

## 2012-12-05 MED ORDER — SODIUM CHLORIDE 0.9 % IV SOLN: 500.0000 mL | INTRAVENOUS | Status: DC

## 2012-12-05 NOTE — Progress Notes (Signed)
Patient did not experience any of the following events: a burn prior to discharge; a fall within the facility; wrong site/side/patient/procedure/implant event; or a hospital transfer or hospital admission upon discharge from the facility. (G8907) Patient did not have preoperative order for IV antibiotic SSI prophylaxis. (G8918)  

## 2012-12-05 NOTE — Patient Instructions (Addendum)
I found and removed one tiny polyp that looks benign.  All else normal.  I will let you know pathology results and when to have another routine colonoscopy by mail.  I appreciate the opportunity to care for you. Iva Boop, MD, FACG  YOU HAD AN ENDOSCOPIC PROCEDURE TODAY AT THE [facility]: Refer to the procedure report that was given to you for any specific questions about what was found during the examination.  If the procedure report does not answer your questions, please call your gastroenterologist to clarify.  If you requested that your care partner not be given the details of your procedure findings, then the procedure report has been included in a sealed envelope for you to review at your convenience later.  YOU SHOULD EXPECT: Some feelings of bloating in the abdomen. Passage of more gas than usual.  Walking can help get rid of the air that was put into your GI tract during the procedure and reduce the bloating. If you had a lower endoscopy (such as a colonoscopy or flexible sigmoidoscopy) you may notice spotting of blood in your stool or on the toilet paper. If you underwent a bowel prep for your procedure, then you may not have a normal bowel movement for a few days.  DIET: Your first meal following the procedure should be a light meal and then it is ok to progress to your normal diet.  A half-sandwich or bowl of soup is an example of a good first meal.  Heavy or fried foods are harder to digest and may make you feel nauseous or bloated.  Likewise meals heavy in dairy and vegetables can cause extra gas to form and this can also increase the bloating.  Drink plenty of fluids but you should avoid alcoholic beverages for 24 hours.  ACTIVITY: Your care partner should take you home directly after the procedure.  You should plan to take it easy, moving slowly for the rest of the day.  You can resume normal activity the day after the procedure however you should NOT DRIVE or use  heavy machinery for 24 hours (because of the sedation medicines used during the test).    SYMPTOMS TO REPORT IMMEDIATELY: A gastroenterologist can be reached at any hour.  During normal business hours, 8:30 AM to 5:00 PM Monday through Friday, call [phone].  After hours and on weekends, please call the GI answering service at [phone] who will take a message and have the physician on call contact you.   Following lower endoscopy (colonoscopy or flexible sigmoidoscopy):  Excessive amounts of blood in the stool  Significant tenderness or worsening of abdominal pains  Swelling of the abdomen that is new, acute  Fever of 100F or higher  FOLLOW UP: If any biopsies were taken you will be contacted by phone or by letter within the next 1-3 weeks.  Call your gastroenterologist if you have not heard about the biopsies in 3 weeks.  Our staff will call the home number listed on your records the next business day following your procedure to check on you and address any questions or concerns that you may have at that time regarding the information given to you following your procedure. This is a courtesy call and so if there is no answer at the home number and we have not heard from you through the emergency physician on call, we will assume that you have returned to your regular daily activities without incident.  SIGNATURES/CONFIDENTIALITY: You and/or  your care partner have signed paperwork which will be entered into your electronic medical record.  These signatures attest to the fact that that the information above on your After Visit Summary has been reviewed and is understood.  Full responsibility of the confidentiality of this discharge information lies with you and/or your care-partner.  Polyp-handout given  Repeat colonoscopy will be determined by pathology.

## 2012-12-05 NOTE — Op Note (Signed)
[facility] [address] [place], [address]   COLONOSCOPY PROCEDURE REPORT  PATIENT: Gabrielle Snow, Gabrielle Snow  MR#: [id number] BIRTHDATE: 1950/07/02 , 62  yrs. old GENDER: Female ENDOSCOPIST: Iva Boop, MD, [facility] REFERRED BY: PROCEDURE DATE:  12/05/2012 PROCEDURE:   Colonoscopy with snare polypectomy First Screening Colonoscopy - Avg.  risk and is 50 yrs.  old or older - No.  Prior Negative Screening - Now for repeat screening. N/A  History of Adenoma - Now for follow-up colonoscopy & has been > or = to 3 yrs.  N/A  Polyps Removed Today? Yes. ASA CLASS:   Class II INDICATIONS:average risk screening and Last colonoscopy performed 10 years ago. MEDICATIONS: propofol (Diprivan) 200mg  IV, MAC sedation, administered by CRNA, and These medications were titrated to patient response per physician's verbal order  DESCRIPTION OF PROCEDURE:   After the risks benefits and alternatives of the procedure were thoroughly explained, informed consent was obtained.  A digital rectal exam revealed no abnormalities of the rectum.   The LB [id number]  endoscope was introduced through the anus and advanced to the cecum, which was identified by both the appendix and ileocecal valve. No adverse events experienced.   The quality of the prep was excellent using Suprep  The instrument was then slowly withdrawn as the colon was fully examined.      COLON FINDINGS: A sessile polyp measuring 5 mm in size was found at the splenic flexure.  A polypectomy was performed with a cold snare.  The resection was complete and the polyp tissue was completely retrieved.   The colon mucosa was otherwise normal.   A right colon retroflexion was performed.  Retroflexed views revealed no abnormalities. The time to cecum=2 minutes 39 seconds. Withdrawal time=8 minutes 12 seconds.  The scope was withdrawn and the procedure completed. COMPLICATIONS: There were no complications.  ENDOSCOPIC  IMPRESSION: 1.   Sessile polyp measuring 5 mm in size was found at the splenic flexure; polypectomy was performed with a cold snare 2.   The colon mucosa was otherwise normal - excellent prep - second screening colonoscopy  RECOMMENDATIONS: Timing of repeat colonoscopy will be determined by pathology findings.   eSigned:  Iva Boop, MD, [facility] 12/05/2012 11:11 AM  cc: Sheliah Hatch, MD and The Patient

## 2012-12-05 NOTE — Progress Notes (Signed)
I.V. Site with small lipoma at insertion site,lipoma there prior to I.V. Insertion.

## 2012-12-05 NOTE — Progress Notes (Signed)
Called to room to assist during endoscopic procedure.  Patient ID and intended procedure confirmed with present staff. Received instructions for my participation in the procedure from the performing physician.  

## 2012-12-06 ENCOUNTER — Telehealth: Payer: Self-pay | Admitting: *Deleted

## 2012-12-06 NOTE — Telephone Encounter (Signed)
Left message on number given in admitting.ewm 

## 2012-12-10 ENCOUNTER — Encounter: Payer: Self-pay | Admitting: Internal Medicine

## 2012-12-10 DIAGNOSIS — Z860100 Personal history of colon polyps, unspecified: Secondary | ICD-10-CM | POA: Insufficient documentation

## 2012-12-10 NOTE — Progress Notes (Signed)
Quick Note:  Diminutive adenoma Repeat colonoscopy starting about 11/2017 ______

## 2013-02-24 ENCOUNTER — Other Ambulatory Visit: Payer: Self-pay | Admitting: Family Medicine

## 2013-02-25 NOTE — Telephone Encounter (Signed)
Med denied. Pt should still have refills on current rx.

## 2013-03-27 ENCOUNTER — Encounter: Payer: Self-pay | Admitting: Family Medicine

## 2013-03-27 ENCOUNTER — Ambulatory Visit (INDEPENDENT_AMBULATORY_CARE_PROVIDER_SITE_OTHER): Payer: BC Managed Care – PPO | Admitting: Family Medicine

## 2013-03-27 VITALS — BP 150/86 | HR 82 | Temp 98.2°F | Wt 138.2 lb

## 2013-03-27 DIAGNOSIS — J019 Acute sinusitis, unspecified: Secondary | ICD-10-CM

## 2013-03-27 MED ORDER — FLUTICASONE PROPIONATE 50 MCG/ACT NA SUSP: NASAL | Status: DC

## 2013-03-27 MED ORDER — AMOXICILLIN-POT CLAVULANATE 875-125 MG PO TABS: 1.0000 | ORAL_TABLET | Freq: Two times a day (BID) | ORAL | Status: DC

## 2013-03-27 NOTE — Progress Notes (Signed)
Pre visit review using our clinic review tool, if applicable. No additional management support is needed unless otherwise documented below in the visit note. 

## 2013-03-27 NOTE — Patient Instructions (Signed)

## 2013-03-27 NOTE — Progress Notes (Signed)
  Subjective:     Gabrielle Snow is a 63 y.o. female who presents for evaluation of sinus pain. Symptoms include: congestion, cough, facial pain, headaches, nasal congestion, purulent rhinorrhea, sinus pressure and tooth pain. Onset of symptoms was 1 week ago. Symptoms have been gradually worsening since that time. Past history is significant for no history of pneumonia or bronchitis. Patient is a non-smoker.  The following portions of the patient's history were reviewed and updated as appropriate: allergies, current medications, past family history, past medical history, past social history, past surgical history and problem list.  Review of Systems Pertinent items are noted in HPI.   Objective:    BP 150/86  Pulse 82  Temp(Src) 98.2 F (36.8 C) (Oral)  Wt 138 lb 3.2 oz (62.687 kg)  SpO2 98% General appearance: alert, cooperative, appears stated age and no distress Ears: normal TM's and external ear canals both ears Nose: green discharge, moderate congestion, turbinates red, swollen, sinus tenderness bilateral Throat: lips, mucosa, and tongue normal; teeth and gums normal Neck: moderate anterior cervical adenopathy, supple, symmetrical, trachea midline and thyroid not enlarged, symmetric, no tenderness/mass/nodules Lungs: clear to auscultation bilaterally Heart: S1, S2 normal    Assessment:    Acute bacterial sinusitis.    Plan:    Nasal steroids per medication orders. Antihistamines per medication orders. Augmentin per medication orders.

## 2013-08-25 ENCOUNTER — Telehealth: Payer: Self-pay | Admitting: Family Medicine

## 2013-08-25 DIAGNOSIS — E2839 Other primary ovarian failure: Secondary | ICD-10-CM

## 2013-08-25 NOTE — Telephone Encounter (Signed)
Please advise, pt has not seen you since 10/29/12. Past due on lipids. CPE scheduled for 11/28/13

## 2013-08-25 NOTE — Telephone Encounter (Signed)
Pt's last DEXA was done at [facility] on 10/27/11- she is due for repeat in August.  We can either send script to [facility] or wait until her CPE in September.

## 2013-08-25 NOTE — Telephone Encounter (Signed)
Caller name: Gabrielle Snow Relation to pt: patient Call back number: [phone] Pharmacy:  Reason for call: patient called stating that it is time to have her bone density test done. Please advise.

## 2013-08-25 NOTE — Telephone Encounter (Signed)
Called pt and lmovm to inform of two options. Will wait for pt to return call to verify what she would like for [place] to do.

## 2013-09-01 ENCOUNTER — Encounter: Payer: Self-pay | Admitting: General Practice

## 2013-09-01 NOTE — Telephone Encounter (Signed)
Cannot reach pt by phone letter mailed.

## 2013-09-05 IMAGING — CR DG KNEE 1-2V*L*
2 series · 2 of 2 positions shown · non-contrast
Comparison: None.

CLINICAL DATA: Knee pain and swelling question of fluid

LEFT KNEE - 1-2 VIEW

[t knee ap left]
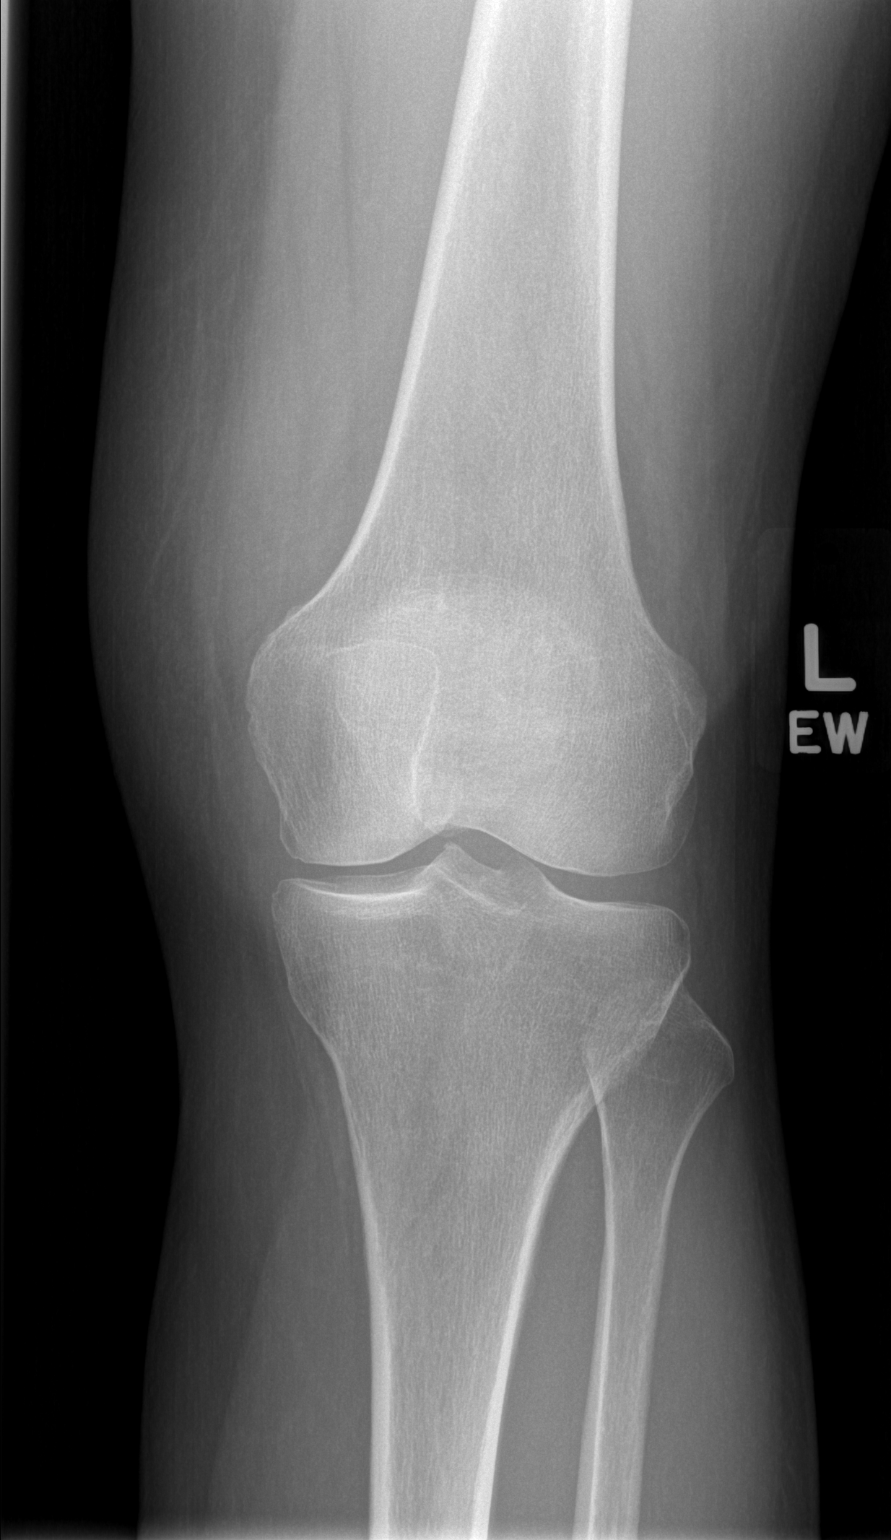

[t knee lat left]
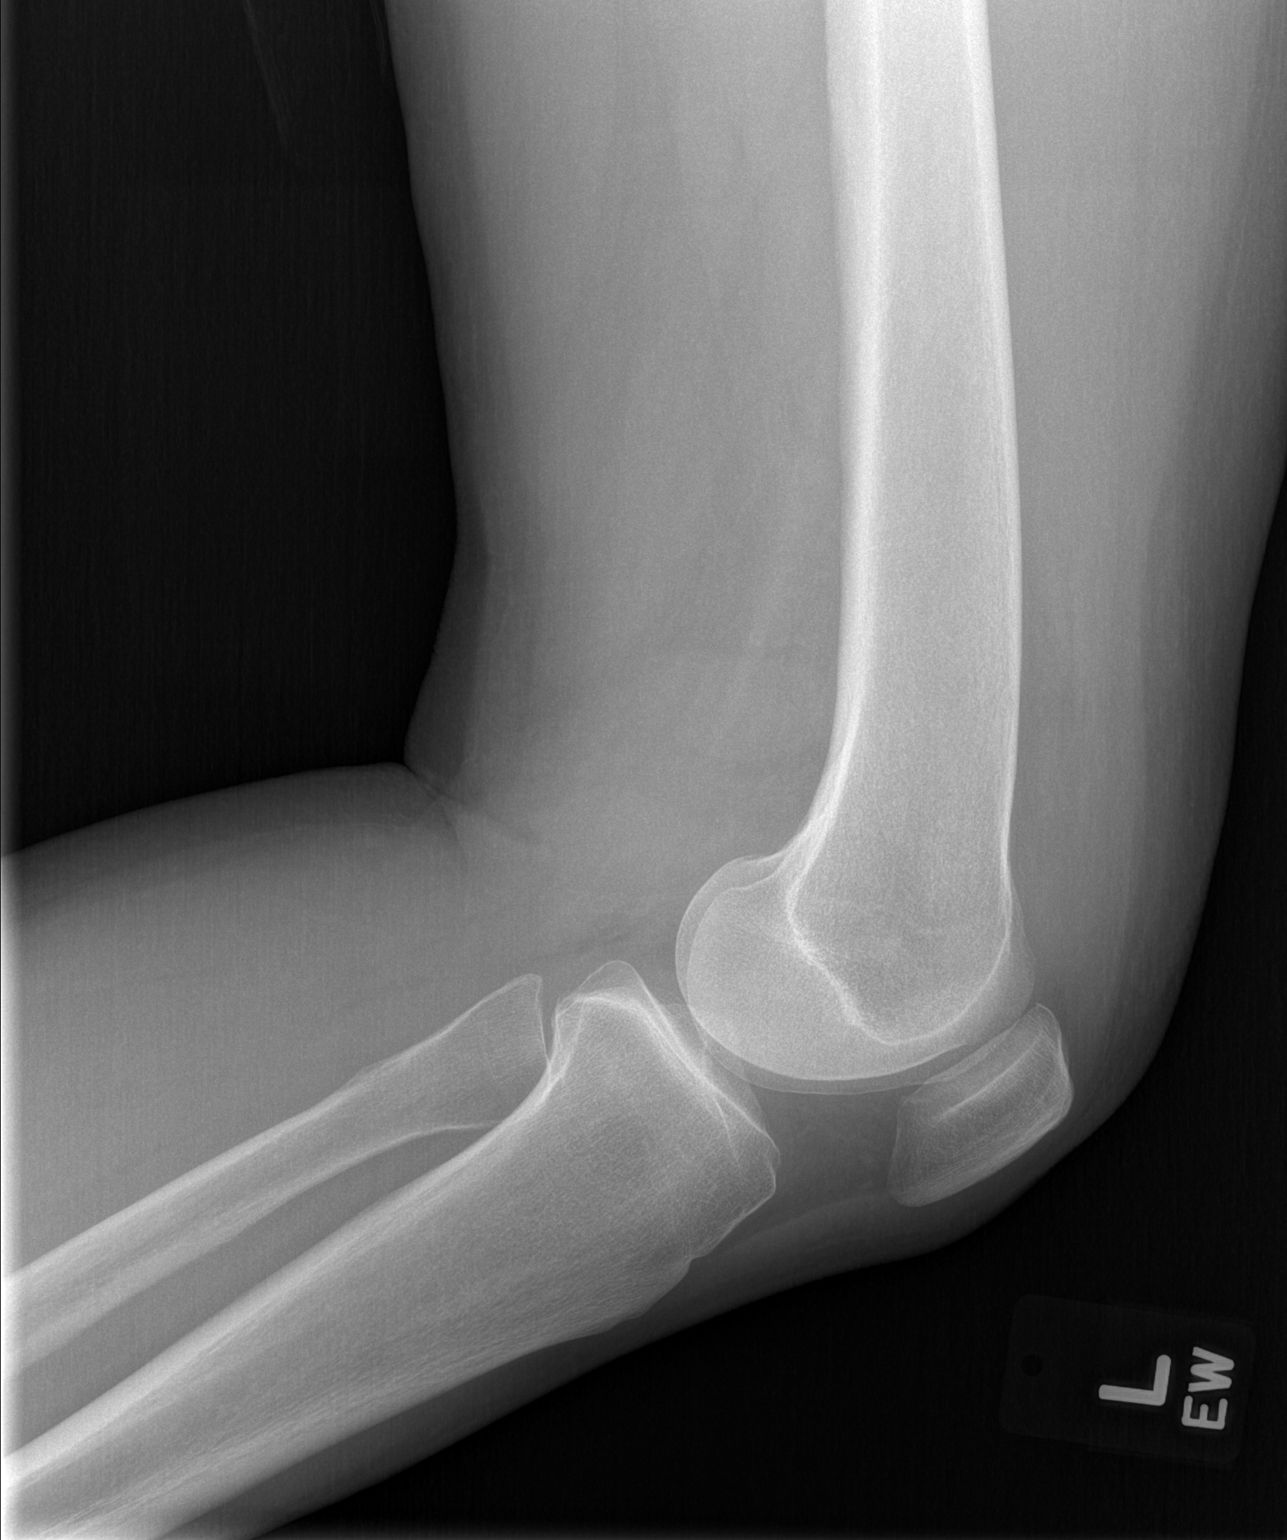

[2 of 2 positions shown; findings below may reference images not displayed]

FINDINGS: Two views of the left knee submitted.  Mild narrowing of
medial joint compartment.  No acute fracture or subluxation.  No
joint effusion.
IMPRESSION: No acute fracture or subluxation.  Mild narrowing of medial joint
compartment.  No joint effusion.

## 2013-09-24 NOTE — Telephone Encounter (Signed)
Pt returned call today and advised that she already has an appt with [facility] in August, just needs the Bone density placed. Please advise on how to order for [facility] and dx. code

## 2013-09-24 NOTE — Telephone Encounter (Signed)
Thanks, pt notified.

## 2013-09-24 NOTE — Telephone Encounter (Signed)
I entered order.  She should be good to go!

## 2013-10-16 ENCOUNTER — Encounter: Payer: Self-pay | Admitting: Internal Medicine

## 2013-10-21 ENCOUNTER — Encounter: Payer: Self-pay | Admitting: Women's Health

## 2013-10-21 ENCOUNTER — Ambulatory Visit (INDEPENDENT_AMBULATORY_CARE_PROVIDER_SITE_OTHER): Payer: BC Managed Care – PPO | Admitting: Women's Health

## 2013-10-21 VITALS — BP 118/74 | Ht 63.0 in | Wt 138.0 lb

## 2013-10-21 DIAGNOSIS — Z01419 Encounter for gynecological examination (general) (routine) without abnormal findings: Secondary | ICD-10-CM

## 2013-10-21 NOTE — Progress Notes (Signed)
Gabrielle Snow [facility] [birth year] [id number]    History:    Presents for annual exam.  Postmenopausal/no HRT/no bleeding. Normal Pap and mammogram history. 2013 DEXA -1.8 at right femoral neck, FRAX 15.3%/1.8%. 2014 negative adenoma on colonoscopy. Primary care manages hypercholesteremia. Immunizations current.  Past medical history, past surgical history, family history and social history were all reviewed and documented in the EPIC chart. Fifth grade schoolteacher. 3 children all doing well. Mother died of old age, sister breast cancer at  age 8 doing well.  ROS:  A  12 point ROS was performed and pertinent positives and negatives are included.  Exam:  Filed Vitals:   10/21/13 1448  BP: 118/74    General appearance:  Normal Thyroid:  Symmetrical, normal in size, without palpable masses or nodularity. Respiratory  Auscultation:  Clear without wheezing or rhonchi Cardiovascular  Auscultation:  Regular rate, without rubs, murmurs or gallops  Edema/varicosities:  Not grossly evident Abdominal  Soft,nontender, without masses, guarding or rebound.  Liver/spleen:  No organomegaly noted  Hernia:  None appreciated  Skin  Inspection:  Grossly normal   Breasts: Examined lying and sitting.     Right: Without masses, retractions, discharge or axillary adenopathy.     Left: Without masses, retractions, discharge or axillary adenopathy. Gentitourinary   Inguinal/mons:  Normal without inguinal adenopathy  External genitalia:  Normal  BUS/Urethra/Skene's glands:  Normal  Vagina:  Normal  Cervix:  Normal  Uterus:   normal in size, shape and contour.  Midline and mobile  Adnexa/parametria:     Rt: Without masses or tenderness.   Lt: Without masses or tenderness.  Anus and perineum: Normal  Digital rectal exam: Normal sphincter tone without palpated masses or tenderness  Assessment/Plan:  63 y.o. MWF G3P3  for annual exam with no complaints.  Postmenopausal/no HRT/no bleeding 2014   Colonoscopy -  benign adenoma Hypercholesteremia/anxiety-primary care manages labs and meds Osteopenia without elevated FRAX  Plan: Keep scheduled repeat DEXA next month. SBE's, continue annual screening mammogram, calcium rich diet, vitamin D 2000 iu's daily encouraged, vitamin D level 35 2014. Continue regular exercise, active  Lifestyle, safety and fall prevention discussed. 2013 Pap normal with negative HR HPV, new screening guidelines reviewed.   Note: This dictation was prepared with Dragon/digital dictation.  Any transcriptional errors that result are unintentional. Huel Cote [facility], 4:50 PM 10/21/2013

## 2013-10-21 NOTE — Patient Instructions (Signed)
Health Recommendations for Postmenopausal Women Respected and ongoing research has looked at the most common causes of death, disability, and poor quality of life in postmenopausal women. The causes include heart disease, diseases of blood vessels, diabetes, depression, cancer, and bone loss (osteoporosis). Many things can be done to help lower the chances of developing these and other common problems. CARDIOVASCULAR DISEASE Heart Disease: A heart attack is a medical emergency. Know the signs and symptoms of a heart attack. Below are things women can do to reduce their risk for heart disease.   Do not smoke. If you smoke, quit.  Aim for a healthy weight. Being overweight causes many preventable deaths. Eat a healthy and balanced diet and drink an adequate amount of liquids.  Get moving. Make a commitment to be more physically active. Aim for 30 minutes of activity on most, if not all days of the week.  Eat for heart health. Choose a diet that is low in saturated fat and cholesterol and eliminate trans fat. Include whole grains, vegetables, and fruits. Read and understand the labels on food containers before buying.  Know your numbers. Ask your caregiver to check your blood pressure, cholesterol (total, HDL, LDL, triglycerides) and blood glucose. Work with your caregiver on improving your entire clinical picture.  High blood pressure. Limit or stop your table salt intake (try salt substitute and food seasonings). Avoid salty foods and drinks. Read labels on food containers before buying. Eating well and exercising can help control high blood pressure. STROKE  Stroke is a medical emergency. Stroke may be the result of a blood clot in a blood vessel in the brain or by a brain hemorrhage (bleeding). Know the signs and symptoms of a stroke. To lower the risk of developing a stroke:  Avoid fatty foods.  Quit smoking.  Control your diabetes, blood pressure, and irregular heart rate. THROMBOPHLEBITIS  (BLOOD CLOT) OF THE LEG  Becoming overweight and leading a stationary lifestyle may also contribute to developing blood clots. Controlling your diet and exercising will help lower the risk of developing blood clots. CANCER SCREENING  Breast Cancer: Take steps to reduce your risk of breast cancer.  You should practice "breast self-awareness." This means understanding the normal appearance and feel of your breasts and should include breast self-examination. Any changes detected, no matter how small, should be reported to your caregiver.  After age 40, you should have a clinical breast exam (CBE) every year.  Starting at age 40, you should consider having a mammogram (breast X-ray) every year.  If you have a family history of breast cancer, talk to your caregiver about genetic screening.  If you are at high risk for breast cancer, talk to your caregiver about having an MRI and a mammogram every year.  Intestinal or Stomach Cancer: Tests to consider are a rectal exam, fecal occult blood, sigmoidoscopy, and colonoscopy. Women who are high risk may need to be screened at an earlier age and more often.  Cervical Cancer:  Beginning at age 30, you should have a Pap test every 3 years as long as the past 3 Pap tests have been normal.  If you have had past treatment for cervical cancer or a condition that could lead to cancer, you need Pap tests and screening for cancer for at least 20 years after your treatment.  If you had a hysterectomy for a problem that was not cancer or a condition that could lead to cancer, then you no longer need Pap tests.    If you are between ages 65 and 70, and you have had normal Pap tests going back 10 years, you no longer need Pap tests.  If Pap tests have been discontinued, risk factors (such as a new sexual partner) need to be reassessed to determine if screening should be resumed.  Some medical problems can increase the chance of getting cervical cancer. In these  cases, your caregiver may recommend more frequent screening and Pap tests.  Uterine Cancer: If you have vaginal bleeding after reaching menopause, you should notify your caregiver.  Ovarian Cancer: Other than yearly pelvic exams, there are no reliable tests available to screen for ovarian cancer at this time except for yearly pelvic exams.  Lung Cancer: Yearly chest X-rays can detect lung cancer and should be done on high risk women, such as cigarette smokers and women with chronic lung disease (emphysema).  Skin Cancer: A complete body skin exam should be done at your yearly examination. Avoid overexposure to the sun and ultraviolet light lamps. Use a strong sun block cream when in the sun. All of these things are important for lowering the risk of skin cancer. MENOPAUSE Menopause Symptoms: Hormone therapy products are effective for treating symptoms associated with menopause:  Moderate to severe hot flashes.  Night sweats.  Mood swings.  Headaches.  Tiredness.  Loss of sex drive.  Insomnia.  Other symptoms. Hormone replacement carries certain risks, especially in older women. Women who use or are thinking about using estrogen or estrogen with progestin treatments should discuss that with their caregiver. Your caregiver will help you understand the benefits and risks. The ideal dose of hormone replacement therapy is not known. The Food and Drug Administration (FDA) has concluded that hormone therapy should be used only at the lowest doses and for the shortest amount of time to reach treatment goals.  OSTEOPOROSIS Protecting Against Bone Loss and Preventing Fracture If you use hormone therapy for prevention of bone loss (osteoporosis), the risks for bone loss must outweigh the risk of the therapy. Ask your caregiver about other medications known to be safe and effective for preventing bone loss and fractures. To guard against bone loss or fractures, the following is recommended:  If  you are younger than age 50, take 1000 mg of calcium and at least 600 mg of Vitamin D per day.  If you are older than age 50 but younger than age 70, take 1200 mg of calcium and at least 600 mg of Vitamin D per day.  If you are older than age 70, take 1200 mg of calcium and at least 800 mg of Vitamin D per day. Smoking and excessive alcohol intake increases the risk of osteoporosis. Eat foods rich in calcium and vitamin D and do weight bearing exercises several times a week as your caregiver suggests. DIABETES Diabetes Mellitus: If you have type I or type 2 diabetes, you should keep your blood sugar under control with diet, exercise, and recommended medication. Avoid starchy and fatty foods, and too many sweets. Being overweight can make diabetes control more difficult. COGNITION AND MEMORY Cognition and Memory: Menopausal hormone therapy is not recommended for the prevention of cognitive disorders such as Alzheimer's disease or memory loss.  DEPRESSION  Depression may occur at any age, but it is common in elderly women. This may be because of physical, medical, social (loneliness), or financial problems and needs. If you are experiencing depression because of medical problems and control of symptoms, talk to your caregiver about this. Physical   activity and exercise may help with mood and sleep. Community and volunteer involvement may improve your sense of value and worth. If you have depression and you feel that the problem is getting worse or becoming severe, talk to your caregiver about which treatment options are best for you. ACCIDENTS  Accidents are common and can be serious in elderly woman. Prepare your house to prevent accidents. Eliminate throw rugs, place hand bars in bath, shower, and toilet areas. Avoid wearing high heeled shoes or walking on wet, snowy, and icy areas. Limit or stop driving if you have vision or hearing problems, or if you feel you are unsteady with your movements and  reflexes. HEPATITIS C Hepatitis C is a type of viral infection affecting the liver. It is spread mainly through contact with blood from an infected person. It can be treated, but if left untreated, it can lead to severe liver damage over the years. Many people who are infected do not know that the virus is in their blood. If you are a "baby-boomer", it is recommended that you have one screening test for Hepatitis C. IMMUNIZATIONS  Several immunizations are important to consider having during your senior years, including:   Tetanus, diphtheria, and pertussis booster shot.  Influenza every year before the flu season begins.  Pneumonia vaccine.  Shingles vaccine.  Others, as indicated based on your specific needs. Talk to your caregiver about these. Document Released: 04/21/2005 Document Revised: 07/14/2013 Document Reviewed: 12/16/2007 ExitCare Patient Information 2015 [employer]. This information is not intended to replace advice given to you by your health care provider. Make sure you discuss any questions you have with your health care provider.  

## 2013-10-22 LAB — URINALYSIS W MICROSCOPIC + REFLEX CULTURE
BACTERIA UA: NONE SEEN
BILIRUBIN URINE: NEGATIVE
CASTS: NONE SEEN
CRYSTALS: NONE SEEN
Glucose, UA: NEGATIVE mg/dL
Hgb urine dipstick: NEGATIVE
KETONES UR: NEGATIVE mg/dL
Leukocytes, UA: NEGATIVE
Nitrite: NEGATIVE
PH: 5.5 (ref 5.0–8.0)
Protein, ur: NEGATIVE mg/dL
SPECIFIC GRAVITY, URINE: 1.012 (ref 1.005–1.030)
SQUAMOUS EPITHELIAL / LPF: NONE SEEN
UROBILINOGEN UA: 0.2 mg/dL (ref 0.0–1.0)

## 2013-10-22 LAB — HM DEXA SCAN

## 2013-10-22 LAB — HM MAMMOGRAPHY

## 2013-10-24 ENCOUNTER — Encounter: Payer: Self-pay | Admitting: General Practice

## 2013-10-27 ENCOUNTER — Telehealth: Payer: Self-pay

## 2013-10-27 NOTE — Telephone Encounter (Signed)
Received order form from [facility] requesting an order for additional diagnostic imaging due to an inconclusive mammogram.  Form labeled and placed in Dr. Virgil Benedict red folder for review and signature.

## 2013-10-28 LAB — HM MAMMOGRAPHY

## 2013-10-30 ENCOUNTER — Encounter: Payer: Self-pay | Admitting: General Practice

## 2013-11-20 NOTE — Telephone Encounter (Signed)
Completed.

## 2013-11-28 ENCOUNTER — Encounter: Payer: BC Managed Care – PPO | Admitting: Family Medicine

## 2013-11-28 ENCOUNTER — Encounter: Payer: Self-pay | Admitting: Family Medicine

## 2013-11-28 ENCOUNTER — Ambulatory Visit (INDEPENDENT_AMBULATORY_CARE_PROVIDER_SITE_OTHER): Payer: BC Managed Care – PPO | Admitting: Family Medicine

## 2013-11-28 VITALS — BP 124/80 | HR 84 | Temp 97.3°F | Resp 16 | Ht 62.25 in | Wt 139.5 lb

## 2013-11-28 DIAGNOSIS — Z0000 Encounter for general adult medical examination without abnormal findings: Secondary | ICD-10-CM

## 2013-11-28 DIAGNOSIS — E785 Hyperlipidemia, unspecified: Secondary | ICD-10-CM

## 2013-11-28 DIAGNOSIS — E559 Vitamin D deficiency, unspecified: Secondary | ICD-10-CM

## 2013-11-28 LAB — CBC WITH DIFFERENTIAL/PLATELET
Basophils Absolute: 0 10*3/uL (ref 0.0–0.1)
Basophils Relative: 0 % (ref 0–1)
EOS PCT: 3 % (ref 0–5)
Eosinophils Absolute: 0.1 10*3/uL (ref 0.0–0.7)
HCT: 34.9 % — ABNORMAL LOW (ref 36.0–46.0)
HEMOGLOBIN: 12 g/dL (ref 12.0–15.0)
LYMPHS ABS: 1.6 10*3/uL (ref 0.7–4.0)
LYMPHS PCT: 32 % (ref 12–46)
MCH: 29.8 pg (ref 26.0–34.0)
MCHC: 34.4 g/dL (ref 30.0–36.0)
MCV: 86.6 fL (ref 78.0–100.0)
MONO ABS: 0.6 10*3/uL (ref 0.1–1.0)
MONOS PCT: 12 % (ref 3–12)
Neutro Abs: 2.6 10*3/uL (ref 1.7–7.7)
Neutrophils Relative %: 53 % (ref 43–77)
Platelets: 244 10*3/uL (ref 150–400)
RBC: 4.03 MIL/uL (ref 3.87–5.11)
RDW: 13.9 % (ref 11.5–15.5)
WBC: 4.9 10*3/uL (ref 4.0–10.5)

## 2013-11-28 LAB — BASIC METABOLIC PANEL
BUN: 17 mg/dL (ref 6–23)
CALCIUM: 9.3 mg/dL (ref 8.4–10.5)
CO2: 28 mEq/L (ref 19–32)
Chloride: 102 mEq/L (ref 96–112)
Creat: 0.81 mg/dL (ref 0.50–1.10)
Glucose, Bld: 85 mg/dL (ref 70–99)
Potassium: 4 mEq/L (ref 3.5–5.3)
Sodium: 137 mEq/L (ref 135–145)

## 2013-11-28 LAB — HEPATIC FUNCTION PANEL
ALT: 15 U/L (ref 0–35)
AST: 19 U/L (ref 0–37)
Albumin: 4.4 g/dL (ref 3.5–5.2)
Alkaline Phosphatase: 83 U/L (ref 39–117)
BILIRUBIN DIRECT: 0.1 mg/dL (ref 0.0–0.3)
BILIRUBIN TOTAL: 0.7 mg/dL (ref 0.2–1.2)
Indirect Bilirubin: 0.6 mg/dL (ref 0.2–1.2)
Total Protein: 8 g/dL (ref 6.0–8.3)

## 2013-11-28 LAB — LIPID PANEL
Cholesterol: 177 mg/dL (ref 0–200)
HDL: 41 mg/dL (ref >39)
LDL CALC: 115 mg/dL — AB (ref 0–99)
Total CHOL/HDL Ratio: 4.3 Ratio
Triglycerides: 104 mg/dL (ref <150)
VLDL: 21 mg/dL (ref 0–40)

## 2013-11-28 NOTE — Progress Notes (Signed)
   Subjective:    Patient ID: Gabrielle Snow, female    DOB: [birth year], 63 y.o.   MRN: [id number]  HPI CPE- UTD on pap, mammo, colonoscopy, DEXA.   Review of Systems Patient reports no vision/ hearing changes, adenopathy,fever, weight change,  persistant/recurrent hoarseness , swallowing issues, chest pain, palpitations, edema, persistant/recurrent cough, hemoptysis, dyspnea (rest/exertional/paroxysmal nocturnal), gastrointestinal bleeding (melena, rectal bleeding), abdominal pain, significant heartburn, bowel changes, GU symptoms (dysuria, hematuria, incontinence), Gyn symptoms (abnormal  bleeding, pain),  syncope, focal weakness, memory loss, numbness & tingling, skin/hair/nail changes, abnormal bruising or bleeding, anxiety, or depression.     Objective:   Physical Exam General Appearance:    Alert, cooperative, no distress, appears stated age  Head:    Normocephalic, without obvious abnormality, atraumatic  Eyes:    PERRL, conjunctiva/corneas clear, EOM's intact, fundi    benign, both eyes  Ears:    Normal TM's and external ear canals, both ears  Nose:   Nares normal, septum midline, mucosa normal, no drainage    or sinus tenderness  Throat:   Lips, mucosa, and tongue normal; teeth and gums normal  Neck:   Supple, symmetrical, trachea midline, no adenopathy;    Thyroid: no enlargement/tenderness/nodules  Back:     Symmetric, no curvature, ROM normal, no CVA tenderness  Lungs:     Clear to auscultation bilaterally, respirations unlabored  Chest Wall:    No tenderness or deformity   Heart:    Regular rate and rhythm, S1 and S2 normal, no murmur, rub   or gallop  Breast Exam:    Deferred to GYN  Abdomen:     Soft, non-tender, bowel sounds active all four quadrants,    no masses, no organomegaly  Genitalia:    Deferred to GYN  Rectal:    Extremities:   Extremities normal, atraumatic, no cyanosis or edema  Pulses:   2+ and symmetric all extremities  Skin:   Skin color,  texture, turgor normal, no rashes or lesions  Lymph nodes:   Cervical, supraclavicular, and axillary nodes normal  Neurologic:   CNII-XII intact, normal strength, sensation and reflexes    throughout          Assessment & Plan:

## 2013-11-28 NOTE — Patient Instructions (Signed)
Follow up in 6 months to recheck cholesterol We'll notify you of your lab results and make any changes if needed Call and schedule a nurse visit for the shingles shot when you are ready Call with any questions or concern Keep up the good work!  You look great! Happy Fall!!!

## 2013-11-28 NOTE — Progress Notes (Signed)
Pre visit review using our clinic review tool, if applicable. No additional management support is needed unless otherwise documented below in the visit note. 

## 2013-11-29 LAB — VITAMIN D 25 HYDROXY (VIT D DEFICIENCY, FRACTURES): Vit D, 25-Hydroxy: 54 ng/mL (ref 30–89)

## 2013-11-29 LAB — TSH: TSH: 4.995 u[IU]/mL — ABNORMAL HIGH (ref 0.350–4.500)

## 2013-11-30 NOTE — Assessment & Plan Note (Signed)
Check labs.  Replete prn. 

## 2013-11-30 NOTE — Assessment & Plan Note (Signed)
Pt's PE WNL.  UTD on health maintenance.  Check labs.  Anticipatory guidance provided.  

## 2013-11-30 NOTE — Assessment & Plan Note (Signed)
Chronic problem.  Check labs.  Adjust meds prn  

## 2013-12-02 ENCOUNTER — Telehealth: Payer: Self-pay | Admitting: Family Medicine

## 2013-12-02 ENCOUNTER — Other Ambulatory Visit: Payer: Self-pay | Admitting: General Practice

## 2013-12-02 ENCOUNTER — Telehealth: Payer: Self-pay

## 2013-12-02 DIAGNOSIS — E039 Hypothyroidism, unspecified: Secondary | ICD-10-CM

## 2013-12-02 MED ORDER — LEVOTHYROXINE SODIUM 50 MCG PO TABS: 50.0000 ug | ORAL_TABLET | Freq: Every day | ORAL | Status: DC

## 2013-12-02 NOTE — Telephone Encounter (Signed)
Message left for pt notifying of results as requested.

## 2013-12-02 NOTE — Telephone Encounter (Signed)
Gabrielle Snow [phone]  Gabrielle Snow returned your call, she said you could call her back and leave a detailed message on the above phone number and if she needed to she would call you back after 3pm

## 2013-12-02 NOTE — Telephone Encounter (Signed)
error 

## 2013-12-04 ENCOUNTER — Other Ambulatory Visit: Payer: Self-pay | Admitting: General Practice

## 2013-12-04 MED ORDER — EZETIMIBE 10 MG PO TABS: ORAL_TABLET | ORAL | Status: AC

## 2014-01-12 ENCOUNTER — Encounter: Payer: Self-pay | Admitting: Family Medicine

## 2014-01-14 ENCOUNTER — Other Ambulatory Visit (INDEPENDENT_AMBULATORY_CARE_PROVIDER_SITE_OTHER): Payer: BC Managed Care – PPO

## 2014-01-14 DIAGNOSIS — E039 Hypothyroidism, unspecified: Secondary | ICD-10-CM

## 2014-01-15 ENCOUNTER — Encounter: Payer: Self-pay | Admitting: General Practice

## 2014-01-15 LAB — TSH: TSH: 1.24 u[IU]/mL (ref 0.35–4.50)

## 2014-01-21 ENCOUNTER — Other Ambulatory Visit: Payer: BC Managed Care – PPO

## 2014-03-18 ENCOUNTER — Encounter: Payer: BC Managed Care – PPO | Admitting: Family Medicine

## 2014-05-26 ENCOUNTER — Telehealth: Payer: Self-pay | Admitting: Family Medicine

## 2014-05-26 NOTE — Telephone Encounter (Signed)
Caller name: [name], [name] Relation to pt: self  Call back number: [phone]   Reason for call:  Pt requesting a rx for [facility] recheck, please fax to [facility] (fax) [phone]. Pt appointment is for 06/30/14

## 2014-05-27 NOTE — Telephone Encounter (Signed)
Noted, will refax.

## 2014-06-24 ENCOUNTER — Other Ambulatory Visit: Payer: BC Managed Care – PPO

## 2014-06-30 LAB — HM MAMMOGRAPHY

## 2014-07-03 ENCOUNTER — Other Ambulatory Visit (INDEPENDENT_AMBULATORY_CARE_PROVIDER_SITE_OTHER): Payer: BC Managed Care – PPO

## 2014-07-03 ENCOUNTER — Encounter: Payer: Self-pay | Admitting: Family Medicine

## 2014-07-03 ENCOUNTER — Encounter: Payer: Self-pay | Admitting: General Practice

## 2014-07-03 DIAGNOSIS — E785 Hyperlipidemia, unspecified: Secondary | ICD-10-CM | POA: Diagnosis not present

## 2014-07-03 DIAGNOSIS — E039 Hypothyroidism, unspecified: Secondary | ICD-10-CM

## 2014-07-03 LAB — HEPATIC FUNCTION PANEL
ALT: 15 U/L (ref 0–35)
AST: 17 U/L (ref 0–37)
Albumin: 4.4 g/dL (ref 3.5–5.2)
Alkaline Phosphatase: 77 U/L (ref 39–117)
BILIRUBIN TOTAL: 0.6 mg/dL (ref 0.2–1.2)
Bilirubin, Direct: 0.1 mg/dL (ref 0.0–0.3)
Indirect Bilirubin: 0.5 mg/dL (ref 0.2–1.2)
Total Protein: 8.2 g/dL (ref 6.0–8.3)

## 2014-07-03 LAB — LIPID PANEL
Cholesterol: 171 mg/dL (ref 0–200)
HDL: 43 mg/dL — AB (ref >=46)
LDL Cholesterol: 113 mg/dL — ABNORMAL HIGH (ref 0–99)
Total CHOL/HDL Ratio: 4 Ratio
Triglycerides: 73 mg/dL (ref <150)
VLDL: 15 mg/dL (ref 0–40)

## 2014-07-03 NOTE — Addendum Note (Signed)
Addended by: Peggyann Shoals on: 07/03/2014 04:25 PM   Modules accepted: Orders

## 2014-07-04 LAB — TSH: TSH: 3.157 u[IU]/mL (ref 0.350–4.500)

## 2014-07-06 ENCOUNTER — Other Ambulatory Visit: Payer: Self-pay | Admitting: General Practice

## 2014-07-06 ENCOUNTER — Encounter: Payer: Self-pay | Admitting: General Practice

## 2014-07-06 MED ORDER — LEVOTHYROXINE SODIUM 50 MCG PO TABS: 50.0000 ug | ORAL_TABLET | Freq: Every day | ORAL | Status: DC

## 2014-07-08 ENCOUNTER — Encounter: Payer: Self-pay | Admitting: Family Medicine

## 2014-07-22 ENCOUNTER — Encounter: Payer: Self-pay | Admitting: Family Medicine

## 2014-11-01 ENCOUNTER — Other Ambulatory Visit: Payer: Self-pay | Admitting: Family Medicine

## 2014-11-02 NOTE — Telephone Encounter (Signed)
Medication filled to pharmacy as requested. No further refills without a cholesterol follow up.

## 2014-11-11 ENCOUNTER — Other Ambulatory Visit: Payer: Self-pay | Admitting: Family Medicine

## 2014-11-11 NOTE — Telephone Encounter (Signed)
Medication filled to pharmacy as requested. Pt needs a cholesterol follow up.

## 2014-12-02 ENCOUNTER — Encounter: Payer: Self-pay | Admitting: Family Medicine

## 2014-12-13 ENCOUNTER — Other Ambulatory Visit: Payer: Self-pay | Admitting: Family Medicine

## 2014-12-15 NOTE — Telephone Encounter (Signed)
Medication filled to pharmacy as requested.   

## 2015-01-11 ENCOUNTER — Ambulatory Visit (INDEPENDENT_AMBULATORY_CARE_PROVIDER_SITE_OTHER): Payer: BC Managed Care – PPO | Admitting: Family

## 2015-01-11 ENCOUNTER — Encounter: Payer: Self-pay | Admitting: Family

## 2015-01-11 VITALS — BP 130/88 | HR 73 | Temp 98.1°F | Resp 16 | Ht 62.5 in | Wt 142.2 lb

## 2015-01-11 DIAGNOSIS — E559 Vitamin D deficiency, unspecified: Secondary | ICD-10-CM | POA: Diagnosis not present

## 2015-01-11 DIAGNOSIS — Z0001 Encounter for general adult medical examination with abnormal findings: Secondary | ICD-10-CM

## 2015-01-11 DIAGNOSIS — E785 Hyperlipidemia, unspecified: Secondary | ICD-10-CM

## 2015-01-11 DIAGNOSIS — H6122 Impacted cerumen, left ear: Secondary | ICD-10-CM | POA: Diagnosis not present

## 2015-01-11 DIAGNOSIS — Z23 Encounter for immunization: Secondary | ICD-10-CM | POA: Diagnosis not present

## 2015-01-11 DIAGNOSIS — Z0000 Encounter for general adult medical examination without abnormal findings: Secondary | ICD-10-CM

## 2015-01-11 LAB — CBC WITH DIFFERENTIAL/PLATELET
Basophils Absolute: 0 10*3/uL (ref 0.0–0.1)
Basophils Relative: 0.7 % (ref 0.0–3.0)
EOS PCT: 1.6 % (ref 0.0–5.0)
Eosinophils Absolute: 0.1 10*3/uL (ref 0.0–0.7)
HEMATOCRIT: 34.9 % — AB (ref 36.0–46.0)
HEMOGLOBIN: 11.7 g/dL — AB (ref 12.0–15.0)
Lymphocytes Relative: 32.3 % (ref 12.0–46.0)
Lymphs Abs: 1.6 10*3/uL (ref 0.7–4.0)
MCHC: 33.4 g/dL (ref 30.0–36.0)
MCV: 86.2 fl (ref 78.0–100.0)
MONO ABS: 0.5 10*3/uL (ref 0.1–1.0)
MONOS PCT: 10.4 % (ref 3.0–12.0)
Neutro Abs: 2.8 10*3/uL (ref 1.4–7.7)
Neutrophils Relative %: 55 % (ref 43.0–77.0)
Platelets: 247 10*3/uL (ref 150.0–400.0)
RBC: 4.05 Mil/uL (ref 3.87–5.11)
RDW: 14.7 % (ref 11.5–15.5)
WBC: 5.1 10*3/uL (ref 4.0–10.5)

## 2015-01-11 LAB — URINALYSIS, ROUTINE W REFLEX MICROSCOPIC
Bilirubin Urine: NEGATIVE
HGB URINE DIPSTICK: NEGATIVE
Ketones, ur: NEGATIVE
Leukocytes, UA: NEGATIVE
NITRITE: NEGATIVE
SPECIFIC GRAVITY, URINE: 1.01 (ref 1.000–1.030)
Total Protein, Urine: NEGATIVE
Urine Glucose: NEGATIVE
Urobilinogen, UA: 0.2 (ref 0.0–1.0)
pH: 6 (ref 5.0–8.0)

## 2015-01-11 LAB — BASIC METABOLIC PANEL
BUN: 20 mg/dL (ref 6–23)
CO2: 27 mEq/L (ref 19–32)
Calcium: 9.7 mg/dL (ref 8.4–10.5)
Chloride: 102 mEq/L (ref 96–112)
Creatinine, Ser: 0.64 mg/dL (ref 0.40–1.20)
GFR: 99.42 mL/min (ref >60.00)
Glucose, Bld: 94 mg/dL (ref 70–99)
Potassium: 3.9 mEq/L (ref 3.5–5.1)
SODIUM: 138 meq/L (ref 135–145)

## 2015-01-11 LAB — LIPID PANEL
CHOLESTEROL: 167 mg/dL (ref 0–200)
HDL: 42 mg/dL (ref >39.00)
LDL CALC: 108 mg/dL — AB (ref 0–99)
NonHDL: 124.65
Total CHOL/HDL Ratio: 4
Triglycerides: 85 mg/dL (ref 0.0–149.0)
VLDL: 17 mg/dL (ref 0.0–40.0)

## 2015-01-11 LAB — HEPATIC FUNCTION PANEL
ALBUMIN: 4.2 g/dL (ref 3.5–5.2)
ALK PHOS: 95 U/L (ref 39–117)
ALT: 26 U/L (ref 0–35)
AST: 25 U/L (ref 0–37)
Bilirubin, Direct: 0.2 mg/dL (ref 0.0–0.3)
Total Bilirubin: 1 mg/dL (ref 0.2–1.2)
Total Protein: 8.9 g/dL — ABNORMAL HIGH (ref 6.0–8.3)

## 2015-01-11 LAB — TSH: TSH: 1.23 u[IU]/mL (ref 0.35–4.50)

## 2015-01-11 LAB — VITAMIN D 25 HYDROXY (VIT D DEFICIENCY, FRACTURES): VITD: 35.4 ng/mL (ref 30.00–100.00)

## 2015-01-11 NOTE — Progress Notes (Signed)
Subjective:    Patient ID: Gabrielle Snow, female    DOB: 26-Jun-1950, 64 y.o.   MRN: [id number]  HPI   Gabrielle Snow is a 64 yr old female who presents today for complete physical.  Immunizations: Tdap today, flu shot up to date. Would like shingles today.   Diet: reports healthy diet.   Wt Readings from Last 3 Encounters:  01/11/15 142 lb 3.2 oz (64.501 kg)  11/28/13 139 lb 8 oz (63.277 kg)  10/21/13 138 lb (62.596 kg)  Exercise: not exercising Colonoscopy: 2014,   Dexa:2015 Pap Smear: 2015- GYN Mammogram: 9/16    Review of Systems  Constitutional: Negative for unexpected weight change.  HENT: Negative for rhinorrhea.   Eyes: Negative for visual disturbance.  Respiratory: Negative for cough and shortness of breath.   Cardiovascular: Negative for chest pain.  Gastrointestinal: Negative for diarrhea and blood in stool.       Chronic constipation tries to eat right  Genitourinary: Negative for dysuria and frequency.  Musculoskeletal: Negative for myalgias and arthralgias.  Skin: Negative for rash.  Neurological: Negative for headaches.  Hematological: Negative for adenopathy.  Psychiatric/Behavioral:       Denies depression   Past Medical History  Diagnosis Date  . Vitamin D deficiency   . Anxiety   . Hyperlipidemia   . Sinusitis, acute maxillary 09/04/2012  . Colon polyp   . Thyroid disease     hypothyroid?    Social History   Social History  . Marital Status: Married    Spouse Name: N/A  . Number of Children: N/A  . Years of Education: N/A   Occupational History  . Not on file.   Social History Main Topics  . Smoking status: Never Smoker   . Smokeless tobacco: Never Used  . Alcohol Use: No  . Drug Use: No  . Sexual Activity: Yes    Birth Control/ Protection: Other-see comments, Post-menopausal     Comment: MENOPAUSE   Other Topics Concern  . Not on file   Social History Narrative    Past Surgical History  Procedure Laterality Date  .  Tonsillectomy    . Colonoscopy    . Breast lumpectomy  1973    left; benign  . Wrist fracture surgery Left 2006    Family History  Problem Relation Age of Onset  . Breast cancer Sister 5  . Lung cancer Father   . Colon cancer Neg Hx   . Stomach cancer Neg Hx     No Known Allergies  Current Outpatient Prescriptions on File Prior to Visit  Medication Sig Dispense Refill  . CALCIUM PO Take by mouth 2 (two) times daily.      Marland Kitchen ezetimibe (ZETIA) 10 MG tablet TAKE 1 TABLET (10 MG TOTAL) BY MOUTH DAILY. 30 tablet 5  . levothyroxine (SYNTHROID, LEVOTHROID) 50 MCG tablet TAKE 1 TABLET BY MOUTH EVERY DAY (NEED APPT) 30 tablet 0  . Melatonin 3 MG TABS Take by mouth as needed.      No current facility-administered medications on file prior to visit.    BP 130/88 mmHg  Pulse 73  Temp(Src) 98.1 F (36.7 C) (Oral)  Resp 16  Ht 5' 2.5" (1.588 m)  Wt 142 lb 3.2 oz (64.501 kg)  BMI 25.58 kg/m2  SpO2 100%       Objective:   Physical Exam Physical Exam  Constitutional: She is oriented to person, place, and time. She appears well-developed and well-nourished. No distress.  HENT:  Head: Normocephalic and atraumatic.  Right Ear: Tympanic membrane and ear canal normal.  Left Ear: Cerumen noted L  Ear canal. After removal of cerumenTympanic membrane and ear canal visualized and appear normal.  Mouth/Throat: Oropharynx is clear and moist.  Eyes: Pupils are equal, round, and reactive to light. No scleral icterus.  Neck: Normal range of motion. No thyromegaly present.  Cardiovascular: Normal rate and regular rhythm.   No murmur heard. Pulmonary/Chest: Effort normal and breath sounds normal. No respiratory distress. He has no wheezes. She has no rales. She exhibits no tenderness.  Abdominal: Soft. Bowel sounds are normal. He exhibits no distension and no mass. There is no tenderness. There is no rebound and no guarding.  Musculoskeletal: She exhibits no edema.  Lymphadenopathy:    She  has no cervical adenopathy.  Neurological: She is alert and oriented to person, place, and time. She has normal reflexes. She exhibits normal muscle tone. Coordination normal.  Skin: Skin is warm and dry.  Psychiatric: She has a normal mood and affect. Her behavior is normal. Judgment and thought content normal.  Breasts: Examined lying Right: Without masses, retractions, discharge or axillary adenopathy.  Left: Without masses, retractions, discharge or axillary adenopathy.  Pelvic: deferred to GYN     Assessment & Plan:          Assessment & Plan:    Ceruminosis is noted.  Wax is removed by manual debridement- left ear. Instructions for home care to prevent wax buildup are given. EKG tracing is personally reviewed.  EKG notes NSR.  No acute changes.

## 2015-01-11 NOTE — Assessment & Plan Note (Signed)
Discussed healthy diet, exercise.  Tdap and zostavax today.   Obtain routine lab work.

## 2015-01-11 NOTE — Patient Instructions (Signed)
Please complete lab work prior to leaving.  Try to add regular exercise.  

## 2015-01-11 NOTE — Assessment & Plan Note (Signed)
Obtain follow up vit D level.  

## 2015-01-11 NOTE — Addendum Note (Signed)
Addended by: Debbrah Alar on: 01/11/2015 01:08 PM   Modules accepted: Miquel Dunn

## 2015-01-11 NOTE — Progress Notes (Signed)
Pre visit review using our clinic review tool, if applicable. No additional management support is needed unless otherwise documented below in the visit note. 

## 2015-01-13 ENCOUNTER — Other Ambulatory Visit: Payer: BC Managed Care – PPO

## 2015-01-13 ENCOUNTER — Telehealth: Payer: Self-pay | Admitting: Family

## 2015-01-13 DIAGNOSIS — R779 Abnormality of plasma protein, unspecified: Secondary | ICD-10-CM

## 2015-01-13 DIAGNOSIS — D649 Anemia, unspecified: Secondary | ICD-10-CM

## 2015-01-13 NOTE — Telephone Encounter (Signed)
Unable to add labs b12 / iron . LMOVM for pt to return to lab.

## 2015-01-13 NOTE — Telephone Encounter (Signed)
Add on form faxed to the lab. Left message for pt to return my call. 

## 2015-01-13 NOTE — Telephone Encounter (Signed)
Notified pt and she voices understanding. Lab appt scheduled for 02/12/15. Future order entered. IFOB placed at front desk for pt to pick up.

## 2015-01-13 NOTE — Telephone Encounter (Signed)
Please contact pt and let her know that her protein level is a little elevated in her bloodwork.  I would like her to return to the lab in 1 month for a follow up LFT (dx elevated serum protein).   Vitamin D, thyroid, kidney function and electrolytes look good.   She is mildly anemic.  Please ask lab to add on b12 and iron, dx anemia, and ask pt to complete IFOB dx anemia.

## 2015-01-14 ENCOUNTER — Other Ambulatory Visit (INDEPENDENT_AMBULATORY_CARE_PROVIDER_SITE_OTHER): Payer: BC Managed Care – PPO

## 2015-01-14 DIAGNOSIS — D509 Iron deficiency anemia, unspecified: Secondary | ICD-10-CM | POA: Diagnosis not present

## 2015-01-15 LAB — IRON: Iron: 50 ug/dL (ref 42–145)

## 2015-01-15 LAB — VITAMIN B12: Vitamin B-12: 277 pg/mL (ref 211–911)

## 2015-01-16 ENCOUNTER — Other Ambulatory Visit: Payer: Self-pay | Admitting: Family Medicine

## 2015-01-17 ENCOUNTER — Encounter: Payer: Self-pay | Admitting: Family

## 2015-01-17 ENCOUNTER — Other Ambulatory Visit: Payer: Self-pay | Admitting: Family Medicine

## 2015-01-18 MED ORDER — LEVOTHYROXINE SODIUM 50 MCG PO TABS: ORAL_TABLET | ORAL | Status: DC

## 2015-01-18 NOTE — Telephone Encounter (Signed)
Levothyroxine refill sent to pharmacy.  Please advise additional concern.

## 2015-02-12 ENCOUNTER — Other Ambulatory Visit (INDEPENDENT_AMBULATORY_CARE_PROVIDER_SITE_OTHER): Payer: BC Managed Care – PPO

## 2015-02-12 DIAGNOSIS — R779 Abnormality of plasma protein, unspecified: Secondary | ICD-10-CM

## 2015-02-12 DIAGNOSIS — R778 Other specified abnormalities of plasma proteins: Secondary | ICD-10-CM | POA: Diagnosis not present

## 2015-02-12 LAB — HEPATIC FUNCTION PANEL
ALBUMIN: 4.6 g/dL (ref 3.6–5.1)
ALT: 15 U/L (ref 6–29)
AST: 18 U/L (ref 10–35)
Alkaline Phosphatase: 77 U/L (ref 33–130)
Bilirubin, Direct: 0.1 mg/dL (ref <=0.2)
Indirect Bilirubin: 0.8 mg/dL (ref 0.2–1.2)
TOTAL PROTEIN: 8.8 g/dL — AB (ref 6.1–8.1)
Total Bilirubin: 0.9 mg/dL (ref 0.2–1.2)

## 2015-02-14 ENCOUNTER — Telehealth: Payer: Self-pay | Admitting: Family

## 2015-02-14 DIAGNOSIS — E8809 Other disorders of plasma-protein metabolism, not elsewhere classified: Secondary | ICD-10-CM

## 2015-02-14 DIAGNOSIS — R779 Abnormality of plasma protein, unspecified: Secondary | ICD-10-CM

## 2015-02-14 NOTE — Telephone Encounter (Signed)
Protein level still a bit elevated.  Please ask pt to complete labs as pended.

## 2015-02-17 NOTE — Telephone Encounter (Signed)
Attempted to reach pt and left message to check her mychart account. Message sent. Future orders entered.

## 2015-02-18 ENCOUNTER — Telehealth: Payer: Self-pay | Admitting: Family

## 2015-02-18 NOTE — Telephone Encounter (Signed)
Caller name: Kaylany   Relationship to patient: Self  Can be reached: [phone]  Reason for call: Pt says that she has questions about her lab results. She would like a call back to discuss further. She says that she received them but she doesn't understand them.   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

## 2015-02-18 NOTE — Telephone Encounter (Signed)
Informed patient of her results and the provider's recommendations:  Debbrah Alar, NP at 02/14/2015 5:13 PM     Status: Signed       Expand All Collapse All   Protein level still a bit elevated. Please ask pt to complete labs as pended.        Patient verbalized understanding. Appointment scheduled for tomorrow, 02/19/15 at 11:00 AM to have labs completed.

## 2015-02-19 ENCOUNTER — Other Ambulatory Visit: Payer: BC Managed Care – PPO

## 2015-02-19 DIAGNOSIS — E8809 Other disorders of plasma-protein metabolism, not elsewhere classified: Secondary | ICD-10-CM

## 2015-02-19 DIAGNOSIS — R779 Abnormality of plasma protein, unspecified: Secondary | ICD-10-CM

## 2015-02-21 ENCOUNTER — Encounter: Payer: Self-pay | Admitting: Family

## 2015-02-21 DIAGNOSIS — M549 Dorsalgia, unspecified: Secondary | ICD-10-CM

## 2015-03-01 ENCOUNTER — Other Ambulatory Visit (INDEPENDENT_AMBULATORY_CARE_PROVIDER_SITE_OTHER): Payer: BC Managed Care – PPO

## 2015-03-01 DIAGNOSIS — M549 Dorsalgia, unspecified: Secondary | ICD-10-CM

## 2015-03-01 LAB — URINALYSIS, ROUTINE W REFLEX MICROSCOPIC
BILIRUBIN URINE: NEGATIVE
HGB URINE DIPSTICK: NEGATIVE
Ketones, ur: NEGATIVE
LEUKOCYTES UA: NEGATIVE
NITRITE: NEGATIVE
RBC / HPF: NONE SEEN (ref 0–?)
Specific Gravity, Urine: 1.015 (ref 1.000–1.030)
Total Protein, Urine: NEGATIVE
Urine Glucose: NEGATIVE
Urobilinogen, UA: 0.2 (ref 0.0–1.0)
WBC UA: NONE SEEN (ref 0–?)
pH: 5 (ref 5.0–8.0)

## 2015-03-02 ENCOUNTER — Encounter: Payer: Self-pay | Admitting: Family

## 2015-03-02 LAB — URINE CULTURE
Colony Count: NO GROWTH
Organism ID, Bacteria: NO GROWTH

## 2015-03-03 LAB — SPEP & IFE WITH QIG
Albumin ELP: 4.2 g/dL (ref 3.8–4.8)
Alpha-1-Globulin: 0.3 g/dL (ref 0.2–0.3)
Alpha-2-Globulin: 0.6 g/dL (ref 0.5–0.9)
Beta 2: 0.3 g/dL (ref 0.2–0.5)
Beta Globulin: 0.4 g/dL (ref 0.4–0.6)
Gamma Globulin: 2.4 g/dL — ABNORMAL HIGH (ref 0.8–1.7)
IGA: 157 mg/dL (ref 69–380)
IGG (IMMUNOGLOBIN G), SERUM: 2140 mg/dL — AB (ref 690–1700)
IgM, Serum: 335 mg/dL — ABNORMAL HIGH (ref 52–322)
TOTAL PROTEIN, SERUM ELECTROPHOR: 8.2 g/dL — AB (ref 6.1–8.1)

## 2015-03-04 LAB — PROTEIN ELECTROPHORESIS, URINE REFLEX: Total Protein, Urine: <4 mg/dL

## 2015-03-05 ENCOUNTER — Encounter: Payer: Self-pay | Admitting: Family

## 2015-03-05 DIAGNOSIS — R779 Abnormality of plasma protein, unspecified: Secondary | ICD-10-CM | POA: Insufficient documentation

## 2015-03-05 NOTE — Telephone Encounter (Signed)
-----   Message from Eliezer Bottom, NP sent at 03/05/2015 12:31 PM EST ----- Regarding: Referral Dr. Marin Olp looked at her lab work and said you can hold off on the referral for now. Just monitor her protein studies periodically and if she develops an M-spike let us know! Thanks and Merry Christmas!!!!  Sarah  ----- Message -----    From: Debbrah Alar, NP    Sent: 03/04/2015  10:26 PM      To: Eliezer Bottom, NP  Judson Roch,  Would you mind glancing at this patient's SPEP please?  Do these results warrant Heme evaluation?    Thank you! Merry christmas!  Gabrielle Snow

## 2015-05-02 ENCOUNTER — Encounter: Payer: Self-pay | Admitting: Family

## 2015-05-29 ENCOUNTER — Encounter: Payer: Self-pay | Admitting: Family Medicine

## 2015-05-29 ENCOUNTER — Ambulatory Visit (INDEPENDENT_AMBULATORY_CARE_PROVIDER_SITE_OTHER): Payer: BC Managed Care – PPO | Admitting: Family Medicine

## 2015-05-29 VITALS — BP 126/80 | HR 96 | Temp 98.6°F | Resp 20 | Ht 62.5 in | Wt 142.0 lb

## 2015-05-29 DIAGNOSIS — J0140 Acute pansinusitis, unspecified: Secondary | ICD-10-CM

## 2015-05-29 MED ORDER — DOXYCYCLINE HYCLATE 100 MG PO CAPS: 100.0000 mg | ORAL_CAPSULE | Freq: Two times a day (BID) | ORAL | Status: DC

## 2015-05-29 NOTE — Progress Notes (Signed)
Pre visit review using our clinic review tool, if applicable. No additional management support is needed unless otherwise documented below in the visit note. 

## 2015-05-29 NOTE — Patient Instructions (Addendum)
It was great meeting you today!  I am sorry you are feeling so badly.   Your exam is consistent with a sinus infection  I have sent in a prescription for Doxycycline, take this twice a day for 7 days.   Also, use OTC Flonase to help with your symptoms  Stay well hydrated and rest over the weekend       Sinusitis, Adult Sinusitis is redness, soreness, and inflammation of the paranasal sinuses. Paranasal sinuses are air pockets within the bones of your face. They are located beneath your eyes, in the middle of your forehead, and above your eyes. In healthy paranasal sinuses, mucus is able to drain out, and air is able to circulate through them by way of your nose. However, when your paranasal sinuses are inflamed, mucus and air can become trapped. This can allow bacteria and other germs to grow and cause infection. Sinusitis can develop quickly and last only a short time (acute) or continue over a long period (chronic). Sinusitis that lasts for more than 12 weeks is considered chronic. CAUSES Causes of sinusitis include:  Allergies.  Structural abnormalities, such as displacement of the cartilage that separates your nostrils (deviated septum), which can decrease the air flow through your nose and sinuses and affect sinus drainage.  Functional abnormalities, such as when the small hairs (cilia) that line your sinuses and help remove mucus do not work properly or are not present. SIGNS AND SYMPTOMS Symptoms of acute and chronic sinusitis are the same. The primary symptoms are pain and pressure around the affected sinuses. Other symptoms include:  Upper toothache.  Earache.  Headache.  Bad breath.  Decreased sense of smell and taste.  A cough, which worsens when you are lying flat.  Fatigue.  Fever.  Thick drainage from your nose, which often is green and may contain pus (purulent).  Swelling and warmth over the affected sinuses. DIAGNOSIS Your health care provider will  perform a physical exam. During your exam, your health care provider may perform any of the following to help determine if you have acute sinusitis or chronic sinusitis:  Look in your nose for signs of abnormal growths in your nostrils (nasal polyps).  Tap over the affected sinus to check for signs of infection.  View the inside of your sinuses using an imaging device that has a light attached (endoscope). If your health care provider suspects that you have chronic sinusitis, one or more of the following tests may be recommended:  Allergy tests.  Nasal culture. A sample of mucus is taken from your nose, sent to a lab, and screened for bacteria.  Nasal cytology. A sample of mucus is taken from your nose and examined by your health care provider to determine if your sinusitis is related to an allergy. TREATMENT Most cases of acute sinusitis are related to a viral infection and will resolve on their own within 10 days. Sometimes, medicines are prescribed to help relieve symptoms of both acute and chronic sinusitis. These may include pain medicines, decongestants, nasal steroid sprays, or saline sprays. However, for sinusitis related to a bacterial infection, your health care provider will prescribe antibiotic medicines. These are medicines that will help kill the bacteria causing the infection. Rarely, sinusitis is caused by a fungal infection. In these cases, your health care provider will prescribe antifungal medicine. For some cases of chronic sinusitis, surgery is needed. Generally, these are cases in which sinusitis recurs more than 3 times per year, despite other treatments. HOME  CARE INSTRUCTIONS  Drink plenty of water. Water helps thin the mucus so your sinuses can drain more easily.  Use a humidifier.  Inhale steam 3-4 times a day (for example, sit in the bathroom with the shower running).  Apply a warm, moist washcloth to your face 3-4 times a day, or as directed by your health care  provider.  Use saline nasal sprays to help moisten and clean your sinuses.  Take medicines only as directed by your health care provider.  If you were prescribed either an antibiotic or antifungal medicine, finish it all even if you start to feel better. SEEK IMMEDIATE MEDICAL CARE IF:  You have increasing pain or severe headaches.  You have nausea, vomiting, or drowsiness.  You have swelling around your face.  You have vision problems.  You have a stiff neck.  You have difficulty breathing.   This information is not intended to replace advice given to you by your health care provider. Make sure you discuss any questions you have with your health care provider.   Document Released: 02/27/2005 Document Revised: 03/20/2014 Document Reviewed: 03/14/2011 Elsevier Interactive Patient Education [employer].

## 2015-05-29 NOTE — Progress Notes (Signed)
   Subjective:    Patient ID: Gabrielle Snow, female    DOB: November 16, 1950, 65 y.o.   MRN: [id number]  HPI  65 year old female presents today with sinusitis type symptoms.  Her symptoms include: fever up to 100.6 ( in the evening) scratchy throat, head congestion, sinus pain and pressure, body aches, fatigue.    She has had her symptoms for 4 days  She has used ibuprofen on and off since Tuesday.   Review of Systems  Constitutional: Positive for fever, activity change, appetite change and fatigue.  HENT: Positive for congestion and sinus pressure. Negative for postnasal drip and rhinorrhea.   Eyes: Negative.   Respiratory: Positive for cough.   Cardiovascular: Negative.   Gastrointestinal: Negative.   Neurological: Negative.   All other systems reviewed and are negative.     Objective:   Physical Exam  Constitutional: She is oriented to person, place, and time. She appears well-developed and well-nourished. No distress.  HENT:  Head: Normocephalic and atraumatic.  Right Ear: External ear normal.  Left Ear: External ear normal.  Nose: Nose normal.  Mouth/Throat: Oropharynx is clear and moist. No oropharyngeal exudate.  Eyes: Conjunctivae and EOM are normal. Pupils are equal, round, and reactive to light. Right eye exhibits no discharge. Left eye exhibits no discharge.  Cardiovascular: Normal rate, regular rhythm, normal heart sounds and intact distal pulses.  Exam reveals no gallop and no friction rub.   No murmur heard. Pulmonary/Chest: Effort normal and breath sounds normal. No respiratory distress. She has no wheezes. She has no rales. She exhibits no tenderness.  Lymphadenopathy:    She has no cervical adenopathy.  Neurological: She is alert and oriented to person, place, and time.  Skin: Skin is warm and dry. No rash noted. She is not diaphoretic. No erythema. No pallor.  Psychiatric: She has a normal mood and affect. Her behavior is normal. Judgment and thought  content normal.  Nursing note and vitals reviewed.     Assessment & Plan:  1. Acute pansinusitis, recurrence not specified - doxycycline (VIBRAMYCIN) 100 MG capsule; Take 1 capsule (100 mg total) by mouth 2 (two) times daily.  Dispense: 14 capsule; Refill: 0 - Flonase - Rest and Ibuprofen

## 2015-07-15 ENCOUNTER — Other Ambulatory Visit: Payer: Self-pay | Admitting: Family

## 2015-07-16 NOTE — Telephone Encounter (Signed)
30 day supply of levothyroxine sent to pharmacy. Pt is due for 6 month follow up of thyroid with Melissa. Please call pt to schedule appt before further refills are needed. Thanks!

## 2015-07-21 NOTE — Telephone Encounter (Signed)
Pt has been scheduled.  °

## 2015-07-26 ENCOUNTER — Other Ambulatory Visit: Payer: Self-pay | Admitting: Family

## 2015-07-26 ENCOUNTER — Other Ambulatory Visit (INDEPENDENT_AMBULATORY_CARE_PROVIDER_SITE_OTHER): Payer: BC Managed Care – PPO

## 2015-07-26 DIAGNOSIS — E039 Hypothyroidism, unspecified: Secondary | ICD-10-CM | POA: Diagnosis not present

## 2015-07-26 LAB — TSH: TSH: 2.73 u[IU]/mL (ref 0.35–4.50)

## 2015-08-02 ENCOUNTER — Ambulatory Visit (INDEPENDENT_AMBULATORY_CARE_PROVIDER_SITE_OTHER): Payer: BC Managed Care – PPO | Admitting: Family

## 2015-08-02 ENCOUNTER — Encounter: Payer: Self-pay | Admitting: Family

## 2015-08-02 VITALS — BP 126/86 | HR 83 | Temp 98.5°F | Resp 16 | Ht 62.5 in | Wt 145.2 lb

## 2015-08-02 DIAGNOSIS — E039 Hypothyroidism, unspecified: Secondary | ICD-10-CM

## 2015-08-02 DIAGNOSIS — E559 Vitamin D deficiency, unspecified: Secondary | ICD-10-CM

## 2015-08-02 DIAGNOSIS — E785 Hyperlipidemia, unspecified: Secondary | ICD-10-CM

## 2015-08-02 MED ORDER — VITAMIN D3 75 MCG (3000 UT) PO TABS: 1.0000 | ORAL_TABLET | Freq: Every day | ORAL | Status: AC

## 2015-08-02 NOTE — Progress Notes (Signed)
Subjective:    Patient ID: Gabrielle Snow, female    DOB: [birth year], 64 y.o.   MRN: [id number]  HPI  Gabrielle Snow is a 65 yr old female who presents today for follow up. Reports that she will be moving to [place] (husband lost job and recently started job in [place]) when she sells her home. Having a lot of family stress- son is in legal trouble and this is causing emotional and financial stress for her and her spouse.    1) Hyperlipidemia- maintained on zetia 10mg . Reports that she is not watching her diet.  Has gained weight due to "stress eating"  Lab Results  Component Value Date   CHOL 167 01/11/2015   HDL 42.00 01/11/2015   LDLCALC 108* 01/11/2015   LDLDIRECT 155.5 09/23/2008   TRIG 85.0 01/11/2015   CHOLHDL 4 01/11/2015   2) Vit D deficiency- last vitamin D level was normal.   3) Hypothyroid- maintained on synthroid.  Lab Results  Component Value Date   TSH 2.73 07/26/2015   Wt Readings from Last 3 Encounters:  08/02/15 145 lb 3.2 oz (65.862 kg)  05/29/15 142 lb (64.411 kg)  01/11/15 142 lb 3.2 oz (64.501 kg)       Review of Systems Past Medical History  Diagnosis Date  . Vitamin D deficiency   . Anxiety   . Hyperlipidemia   . Sinusitis, acute maxillary 09/04/2012  . Colon polyp   . Thyroid disease     hypothyroid?     Social History   Social History  . Marital Status: Married    Spouse Name: N/A  . Number of Children: N/A  . Years of Education: N/A   Occupational History  . Not on file.   Social History Main Topics  . Smoking status: Never Smoker   . Smokeless tobacco: Never Used  . Alcohol Use: No  . Drug Use: No  . Sexual Activity: Yes    Birth Control/ Protection: Other-see comments, Post-menopausal     Comment: MENOPAUSE   Other Topics Concern  . Not on file   Social History Narrative    Past Surgical History  Procedure Laterality Date  . Tonsillectomy    . Colonoscopy    . Breast lumpectomy  1973    left; benign  .  Wrist fracture surgery Left 2006    Family History  Problem Relation Age of Onset  . Breast cancer Sister 20  . Lung cancer Father   . Colon cancer Neg Hx   . Stomach cancer Neg Hx     No Known Allergies  Current Outpatient Prescriptions on File Prior to Visit  Medication Sig Dispense Refill  . CALCIUM PO Take by mouth 2 (two) times daily.      Marland Kitchen ezetimibe (ZETIA) 10 MG tablet TAKE 1 TABLET (10 MG TOTAL) BY MOUTH DAILY. 30 tablet 5  . levothyroxine (SYNTHROID, LEVOTHROID) 50 MCG tablet TAKE 1 TABLET BY MOUTH EVERY DAY 30 tablet 0  . Melatonin 3 MG TABS Take by mouth as needed. Reported on 08/02/2015     No current facility-administered medications on file prior to visit.    BP 126/86 mmHg  Pulse 83  Temp(Src) 98.5 F (36.9 C) (Oral)  Resp 16  Ht 5' 2.5" (1.588 m)  Wt 145 lb 3.2 oz (65.862 kg)  BMI 26.12 kg/m2  SpO2 98%       Objective:   Physical Exam  Constitutional: She is oriented to person, place, and time. She  appears well-developed and well-nourished.  HENT:  Head: Normocephalic and atraumatic.  Cardiovascular: Normal rate, regular rhythm and normal heart sounds.   No murmur heard. Pulmonary/Chest: Effort normal and breath sounds normal. No respiratory distress. She has no wheezes.  Neurological: She is alert and oriented to person, place, and time.  Psychiatric: She has a normal mood and affect. Her behavior is normal. Judgment and thought content normal.          Assessment & Plan:

## 2015-08-02 NOTE — Assessment & Plan Note (Addendum)
Reports that she takes vit D sporadically, advised pt to take 3000iu once daily of vit D.

## 2015-08-02 NOTE — Progress Notes (Signed)
Pre visit review using our clinic review tool, if applicable. No additional management support is needed unless otherwise documented below in the visit note. 

## 2015-08-02 NOTE — Assessment & Plan Note (Signed)
Lipids stable on zetia. Continue same.

## 2015-08-02 NOTE — Assessment & Plan Note (Signed)
Stable on synthroid, continue current dose.

## 2015-08-16 ENCOUNTER — Other Ambulatory Visit: Payer: Self-pay | Admitting: Family

## 2015-08-16 NOTE — Telephone Encounter (Signed)
Rx sent to the pharmacy by e-script.//AB/CMA 

## 2015-09-29 ENCOUNTER — Ambulatory Visit (INDEPENDENT_AMBULATORY_CARE_PROVIDER_SITE_OTHER): Payer: BC Managed Care – PPO | Admitting: Women's Health

## 2015-09-29 ENCOUNTER — Encounter: Payer: Self-pay | Admitting: Women's Health

## 2015-09-29 VITALS — BP 126/80 | Ht 62.0 in | Wt 144.0 lb

## 2015-09-29 DIAGNOSIS — Z01419 Encounter for gynecological examination (general) (routine) without abnormal findings: Secondary | ICD-10-CM

## 2015-09-29 NOTE — Progress Notes (Signed)
Gabrielle Snow [id number]    History:    Presents for annual exam.  Postmenopausal on no HRT with no bleeding. Normal Pap and mammogram history. 2014 colonoscopy Polyp- adenoma. Hypercholesterolemia primary care manages labs. Current on immunizations. 2013 DEXA T score -1.8 FRAX 15.3%/1.8%. Sister breast cancer age 64 survivor.  Past medical history, past surgical history, family history and social history were all reviewed and documented in the EPIC chart. Retired [employer], moving to [place] this summer. Has 3 children to her doing well, son alcohol addiction and legal problems but doing better now.  ROS:  A ROS was performed and pertinent positives and negatives are included.  Exam:  Filed Vitals:   09/29/15 1204  BP: 126/80    General appearance:  Normal Thyroid:  Symmetrical, normal in size, without palpable masses or nodularity. Respiratory  Auscultation:  Clear without wheezing or rhonchi Cardiovascular  Auscultation:  Regular rate, without rubs, murmurs or gallops  Edema/varicosities:  Not grossly evident Abdominal  Soft,nontender, without masses, guarding or rebound.  Liver/spleen:  No organomegaly noted  Hernia:  None appreciated  Skin  Inspection:  Grossly normal   Breasts: Examined lying and sitting.     Right: Without masses, retractions, discharge or axillary adenopathy.     Left: Without masses, retractions, discharge or axillary adenopathy. Gentitourinary   Inguinal/mons:  Normal without inguinal adenopathy  External genitalia:  Normal  BUS/Urethra/Skene's glands:  Normal  Vagina:  Normal  Cervix:  Normal  Uterus:  normal in size, shape and contour.  Midline and mobile  Adnexa/parametria:     Rt: Without masses or tenderness.   Lt: Without masses or tenderness.  Anus and perineum: Normal  Digital rectal exam: Normal sphincter tone without palpated masses or tenderness  Assessment/Plan:  65 y.o. MWF G3 P3 for annual exam with  no complaints.  Postmenopausal/no HRT/no bleeding Hypercholesterolemia/hypothyroidism-primary care manages labs and meds Osteopenia without elevated FRAX  Plan:  DEXA, increase regular weightbearing exercise, home safety and fall prevention discussed. SBE's, annual screening mammogram, calcium rich diet, continue vitamin D supplement per primary cares recommendation. UA, Pap with HR HPV typing, new screening guidelines reviewed.  Huel Cote WHNP, 1:07 PM 09/29/2015

## 2015-09-29 NOTE — Patient Instructions (Signed)

## 2015-10-01 LAB — PAP, TP IMAGING W/ HPV RNA, RFLX HPV TYPE 16,18/45: HPV mRNA, High Risk: NOT DETECTED

## 2015-10-07 ENCOUNTER — Encounter: Payer: Self-pay | Admitting: Women's Health

## 2016-01-20 ENCOUNTER — Encounter: Payer: Self-pay | Admitting: Women's Health

## 2016-02-11 ENCOUNTER — Other Ambulatory Visit: Payer: Self-pay | Admitting: Family

## 2016-02-11 NOTE — Telephone Encounter (Signed)
Refill sent per [facility] refill protocol/SLS  

## 2018-03-18 ENCOUNTER — Encounter: Payer: Self-pay | Admitting: Internal Medicine
